# Patient Record
Sex: Female | Born: 2000 | Race: White | Hispanic: No | Marital: Single | Smoking: Never smoker
Health system: Southern US, Community
[De-identification: ages and names within clinical notes are randomized; demographics above are authoritative.]

## PROBLEM LIST (undated history)

## (undated) DIAGNOSIS — F909 Attention-deficit hyperactivity disorder, unspecified type: Secondary | ICD-10-CM

## (undated) DIAGNOSIS — F419 Anxiety disorder, unspecified: Secondary | ICD-10-CM

## (undated) DIAGNOSIS — J45909 Unspecified asthma, uncomplicated: Secondary | ICD-10-CM

## (undated) HISTORY — DX: Unspecified asthma, uncomplicated: J45.909

---
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
## 2022-03-12 ENCOUNTER — Emergency Department
Admission: EM | Admit: 2022-03-12 | Discharge: 2022-03-12 | Discharge status: Against Medical Advice | Payer: Self-pay | Source: Home / Self Care

## 2022-03-12 ENCOUNTER — Encounter: Payer: Self-pay | Admitting: Emergency Medicine

## 2022-03-12 ENCOUNTER — Emergency Department: Payer: Managed Care, Other (non HMO)

## 2022-03-12 ENCOUNTER — Other Ambulatory Visit: Payer: Self-pay

## 2022-03-12 DIAGNOSIS — J039 Acute tonsillitis, unspecified: Secondary | ICD-10-CM | POA: Diagnosis not present

## 2022-03-12 DIAGNOSIS — J181 Lobar pneumonia, unspecified organism: Secondary | ICD-10-CM | POA: Diagnosis not present

## 2022-03-12 DIAGNOSIS — R509 Fever, unspecified: Secondary | ICD-10-CM | POA: Diagnosis present

## 2022-03-12 DIAGNOSIS — Z20822 Contact with and (suspected) exposure to covid-19: Secondary | ICD-10-CM | POA: Insufficient documentation

## 2022-03-12 LAB — BASIC METABOLIC PANEL
Anion gap: 7 (ref 5–15)
BUN: 8 mg/dL (ref 6–20)
CO2: 24 mmol/L (ref 22–32)
Calcium: 8.7 mg/dL — ABNORMAL LOW (ref 8.9–10.3)
Chloride: 103 mmol/L (ref 98–111)
Creatinine, Ser: 0.71 mg/dL (ref 0.44–1.00)
GFR, Estimated: >60 mL/min (ref >60)
Glucose, Bld: 86 mg/dL (ref 70–99)
Potassium: 3.6 mmol/L (ref 3.5–5.1)
Sodium: 134 mmol/L — ABNORMAL LOW (ref 135–145)

## 2022-03-12 LAB — GROUP A STREP BY PCR: Group A Strep by PCR: NOT DETECTED

## 2022-03-12 LAB — TROPONIN I (HIGH SENSITIVITY): Troponin I (High Sensitivity): 3 ng/L (ref <18)

## 2022-03-12 LAB — CBC
HCT: 37.4 % (ref 36.0–46.0)
Hemoglobin: 12.3 g/dL (ref 12.0–15.0)
MCH: 27.5 pg (ref 26.0–34.0)
MCHC: 32.9 g/dL (ref 30.0–36.0)
MCV: 83.5 fL (ref 80.0–100.0)
Platelets: 298 10*3/uL (ref 150–400)
RBC: 4.48 MIL/uL (ref 3.87–5.11)
RDW: 12.6 % (ref 11.5–15.5)
WBC: 11.5 10*3/uL — ABNORMAL HIGH (ref 4.0–10.5)
nRBC: 0 % (ref 0.0–0.2)

## 2022-03-12 NOTE — ED Triage Notes (Signed)
Pt to ED from home c/o fever, sore throat, congestion, chest pain, SOB, and fatigue for about 3 weeks.  States treated for strep 2 weeks ago and finish ABX.  Nausea without vomiting or diarrhea.  Chest pain is mid and achy feeling.  Pt A&Ox4, chest rise even and unlabored, skin WNL and in NAD at this time. ?

## 2022-03-13 ENCOUNTER — Emergency Department
Admission: EM | Admit: 2022-03-13 | Discharge: 2022-03-13 | Disposition: A | Discharge status: Home / Self Care | Payer: Managed Care, Other (non HMO) | Attending: Emergency Medicine | Admitting: Emergency Medicine

## 2022-03-13 DIAGNOSIS — J039 Acute tonsillitis, unspecified: Secondary | ICD-10-CM

## 2022-03-13 DIAGNOSIS — J189 Pneumonia, unspecified organism: Secondary | ICD-10-CM

## 2022-03-13 LAB — RESP PANEL BY RT-PCR (FLU A&B, COVID) ARPGX2
Influenza A by PCR: NEGATIVE
Influenza B by PCR: NEGATIVE
SARS Coronavirus 2 by RT PCR: NEGATIVE

## 2022-03-13 LAB — POC URINE PREG, ED: Preg Test, Ur: NEGATIVE

## 2022-03-13 LAB — MONONUCLEOSIS SCREEN: Mono Screen: NEGATIVE

## 2022-03-13 MED ORDER — CLINDAMYCIN HCL 300 MG PO CAPS: 300.0000 mg | ORAL_CAPSULE | Freq: Three times a day (TID) | ORAL | 0 refills | Status: DC

## 2022-03-13 MED ORDER — SODIUM CHLORIDE 0.9 % IV BOLUS
1000.0000 mL | Freq: Once | INTRAVENOUS | Status: AC
  Administered 2022-03-13: 1000 mL via INTRAVENOUS

## 2022-03-13 MED ORDER — DEXAMETHASONE SODIUM PHOSPHATE 10 MG/ML IJ SOLN
10.0000 mg | Freq: Once | INTRAMUSCULAR | Status: AC
  Administered 2022-03-13: 10 mg via INTRAVENOUS
  Filled 2022-03-13: qty 1

## 2022-03-13 MED ORDER — CLINDAMYCIN HCL 150 MG PO CAPS
300.0000 mg | ORAL_CAPSULE | Freq: Once | ORAL | Status: AC
  Administered 2022-03-13: 300 mg via ORAL
  Filled 2022-03-13: qty 2

## 2022-03-13 MED ORDER — KETOROLAC TROMETHAMINE 30 MG/ML IJ SOLN
10.0000 mg | Freq: Once | INTRAMUSCULAR | Status: AC
  Administered 2022-03-13: 9.9 mg via INTRAVENOUS
  Filled 2022-03-13: qty 1

## 2022-03-13 MED ORDER — MAGIC MOUTHWASH: ORAL | 0 refills | Status: DC

## 2022-03-13 MED ORDER — MAGIC MOUTHWASH
10.0000 mL | Freq: Once | ORAL | Status: AC
Start: 2022-03-13 — End: 2022-03-13
  Administered 2022-03-13: 10 mL via ORAL
  Filled 2022-03-13 (×2): qty 10

## 2022-03-13 NOTE — Discharge Instructions (Signed)
1.  Take antibiotic as prescribed (Clindamycin 300mg   ?3 times daily x10 days). ?2.  You may use Magic mouthwash as needed for throat discomfort. ?3.  Alternate Tylenol and Ibuprofen every 4 hours as needed for fever greater than 100.4 ?F. ?4.  Drink plenty of fluids daily. ?5.  Return to the ER for worsening symptoms, persistent vomiting, difficulty breathing or other concerns. ?

## 2022-03-13 NOTE — ED Provider Notes (Signed)
? ?[facility] ?Provider Note ? ? ? Event Date/Time  ? First MD Initiated Contact with Patient 03/13/22 0249   ?  (approximate) ? ? ?History  ? ?Chest Pain, Fatigue, Nasal Congestion, Sore Throat, and Fever ? ? ?HPI ? ?Olivia Tran is a 21 y.o. female who presents to the ED from school with a chief complaint of fever, sore throat, cough, congestion, chest pain, shortness of breath, fatigue.  Symptoms ongoing for 3 weeks.  States she was treated for strep 2 weeks ago with penicillin which she has finished.  Endorses nausea and sore throat.  Chest feels sore and achy.  Denies fever, chills, shortness of breath, abdominal pain, vomiting or dizziness.  Patient has Nexplanon. ?  ? ? ?Past Medical History  ? ?Past Medical History:  ?Diagnosis Date  ? ADHD   ? Anxiety   ? ? ? ?Active Problem List  ?There are no problems to display for this patient. ? ? ? ?Past Surgical History  ?History reviewed. No pertinent surgical history. ? ? ?Home Medications  ? ?Prior to Admission medications   ?Medication Sig Start Date End Date Taking? Authorizing Provider  ?etonogestrel (NEXPLANON) 68 MG IMPL implant 1 each by Subdermal route once.    [provider]  ? ? ? ?Allergies  ?Other ? ? ?Family History  ?History reviewed. No pertinent family history. ? ? ?Physical Exam  ?Triage Vital Signs: ?ED Triage Vitals  ?Enc Vitals Group  ?   BP 03/12/22 2256 113/65  ?   Pulse Rate 03/12/22 2256 (!) 118  ?   Resp 03/12/22 2256 16  ?   Temp 03/12/22 2256 100.1 ?F (37.8 ?C)  ?   Temp Source 03/12/22 2256 Oral  ?   SpO2 03/12/22 2256 98 %  ?   Weight 03/12/22 2302 100 lb (45.4 kg)  ?   Height 03/12/22 2302 5\' 1"  (1.549 m)  ?   Head Circumference --   ?   Peak Flow --   ?   Pain Score 03/12/22 2302 4  ?   Pain Loc --   ?   Pain Edu? --   ?   Excl. in [place]? --   ? ? ?Updated Vital Signs: ?BP 112/69   Pulse (!) 105   Temp 100.1 ?F (37.8 ?C) (Oral)   Resp 15   Ht 5\' 1"  (1.549 m)   Wt 45.4 kg   LMP 02/19/2022  (Approximate)   SpO2 96%   BMI 18.89 kg/m?  ? ? ?General: Awake, no distress.  ?CV:  Tachycardic.  Good peripheral perfusion.  ?Resp:  Normal effort.  CTA B. ?Abd:  Nontender.  No distention.  ?Other:  Rhinorrhea/congestion noted.  Moderately erythematous oropharynx with bilaterally symmetrical enlarged tonsils with exudates.  No peritonsillar abscess.  There is no hoarse or muffled voice.  There is no drooling.  Shotty anterior lymphadenopathy.  Supple neck without meningismus.  No petechiae. ? ? ?ED Results / Procedures / Treatments  ?Labs ?(all labs ordered are listed, but only abnormal results are displayed) ?Labs Reviewed  ?BASIC METABOLIC PANEL - Abnormal; Notable for the following components:  ?    Result Value  ? Sodium 134 (*)   ? Calcium 8.7 (*)   ? All other components within normal limits  ?CBC - Abnormal; Notable for the following components:  ? WBC 11.5 (*)   ? All other components within normal limits  ?RESP PANEL BY RT-PCR (FLU A&B, COVID) ARPGX2  ?GROUP A  STREP BY PCR  ?MONONUCLEOSIS SCREEN  ?POC URINE PREG, ED  ?TROPONIN I (HIGH SENSITIVITY)  ? ? ? ?EKG ? ?ED ECG REPORT ?I, Paulette Blanch, the attending physician, personally viewed and interpreted this ECG. ? ? Date: 03/13/2022 ? EKG Time: 2302 ? Rate: 116 ? Rhythm: sinus tachycardia ? Axis: Normal ? Intervals:none ? ST&T Change: Nonspecific ? ? ? ?RADIOLOGY ?I have independently visualized and interpreted patient's chest x-ray as well as noted the radiology interpretation: ? ?Chest x-ray: Streaky retrocardiac opacity at left lung base atelectasis versus pneumonia.  Mild central bronchial thickening. ? ?Official radiology report(s): ?DG Chest 2 View ? ?Result Date: 03/12/2022 ?CLINICAL DATA:  Chest pain. EXAM: CHEST - 2 VIEW COMPARISON:  None. FINDINGS: The heart is normal in size. Streaky retrocardiac opacity at the left lung base. Mild central bronchial thickening. No pulmonary edema, pleural effusion, or pneumothorax. No acute osseous findings.  Nexplanon in the left upper extremity. IMPRESSION: Streaky retrocardiac opacity at the left lung base may be atelectasis or pneumonia. Mild central bronchial thickening. Electronically Signed   By: Keith Rake M.D.   On: 03/12/2022 23:30   ? ? ?PROCEDURES: ? ?Critical Care performed: No ? ?.1-3 Lead EKG Interpretation ?Performed by: Paulette Blanch, MD ?Authorized by: Paulette Blanch, MD  ? ?  Interpretation: abnormal   ?  ECG rate:  110 ?  ECG rate assessment: tachycardic   ?  Rhythm: sinus tachycardia   ?  Ectopy: none   ?  Conduction: normal   ?Comments:  ?   Patient placed on cardiac monitor to evaluate for arrhythmias ? ? ?MEDICATIONS ORDERED IN ED: ?Medications  ?sodium chloride 0.9 % bolus 1,000 mL (0 mLs Intravenous Stopped 03/13/22 0431)  ?ketorolac (TORADOL) 30 MG/ML injection 9.9 mg (9.9 mg Intravenous Given 03/13/22 0347)  ?dexamethasone (DECADRON) injection 10 mg (10 mg Intravenous Given 03/13/22 0343)  ?magic mouthwash (10 mLs Oral Given 03/13/22 0458)  ?clindamycin (CLEOCIN) capsule 300 mg (300 mg Oral Given 03/13/22 0330)  ? ? ? ?IMPRESSION / MDM / ASSESSMENT AND PLAN / ED COURSE  ?I reviewed the triage vital signs and the nursing notes. ?             ?               ?21 year old female presenting with a several week history of cough, congestion, sore throat and fatigue.  Differential diagnosis includes but is not limited to viral process such as COVID-19, influenza, community-acquired pneumonia, pharyngitis, tonsillitis, mononucleosis, etc.  I have personally reviewed patient's records and am unable to see recent records of illness; last seen at urgent care 10/01/2021 for viral illness. ? ?The patient is on the cardiac monitor to evaluate for evidence of arrhythmia and/or significant heart rate changes. ? ?Laboratory results demonstrate mild leukocytosis with WBC 11.5, normal creatinine 0.71, negative troponin.  Respiratory panel and group A strep are negative.  Chest x-ray with possible pneumonia.  Will  check Monospot.  Administer IV fluid hydration, Decadron for tonsillar swelling, ketorolac for body aches and pain, Magic mouthwash for sore throat.  Will start clindamycin which should cover both tonsillitis and pneumonia. ? ?Clinical Course as of 03/13/22 0525  ?Thu Mar 13, 2022  ?0523 Patient resting in no acute distress.  Heart rate normalized.  Feeling significantly better.  Updated her on negative monoscreen.  Will discharge home with prescription for clindamycin and Magic mouthwash.  Will refer to ENT for outpatient follow-up.  Strict return precautions given.  Patient verbalizes understanding and agrees with plan of care. [JS]  ?  ?Clinical Course User Index ?[JS] Paulette Blanch, MD  ? ? ? ?FINAL CLINICAL IMPRESSION(S) / ED DIAGNOSES  ? ?Final diagnoses:  ?Tonsillitis  ?Community acquired pneumonia of left lung, unspecified part of lung  ? ? ? ?Rx / DC Orders  ? ?ED Discharge Orders   ? ? None  ? ?  ? ? ? ?Note:  This document was prepared using Dragon voice recognition software and may include unintentional dictation errors. ?  ?Paulette Blanch, MD ?03/13/22 [phone] ? ?

## 2022-07-16 IMAGING — CR DG CHEST 2V
2 series · 2 of 2 positions shown · non-contrast
Comparison: None.

CLINICAL DATA: Chest pain.

EXAM:
CHEST - 2 VIEW

[chest pa]
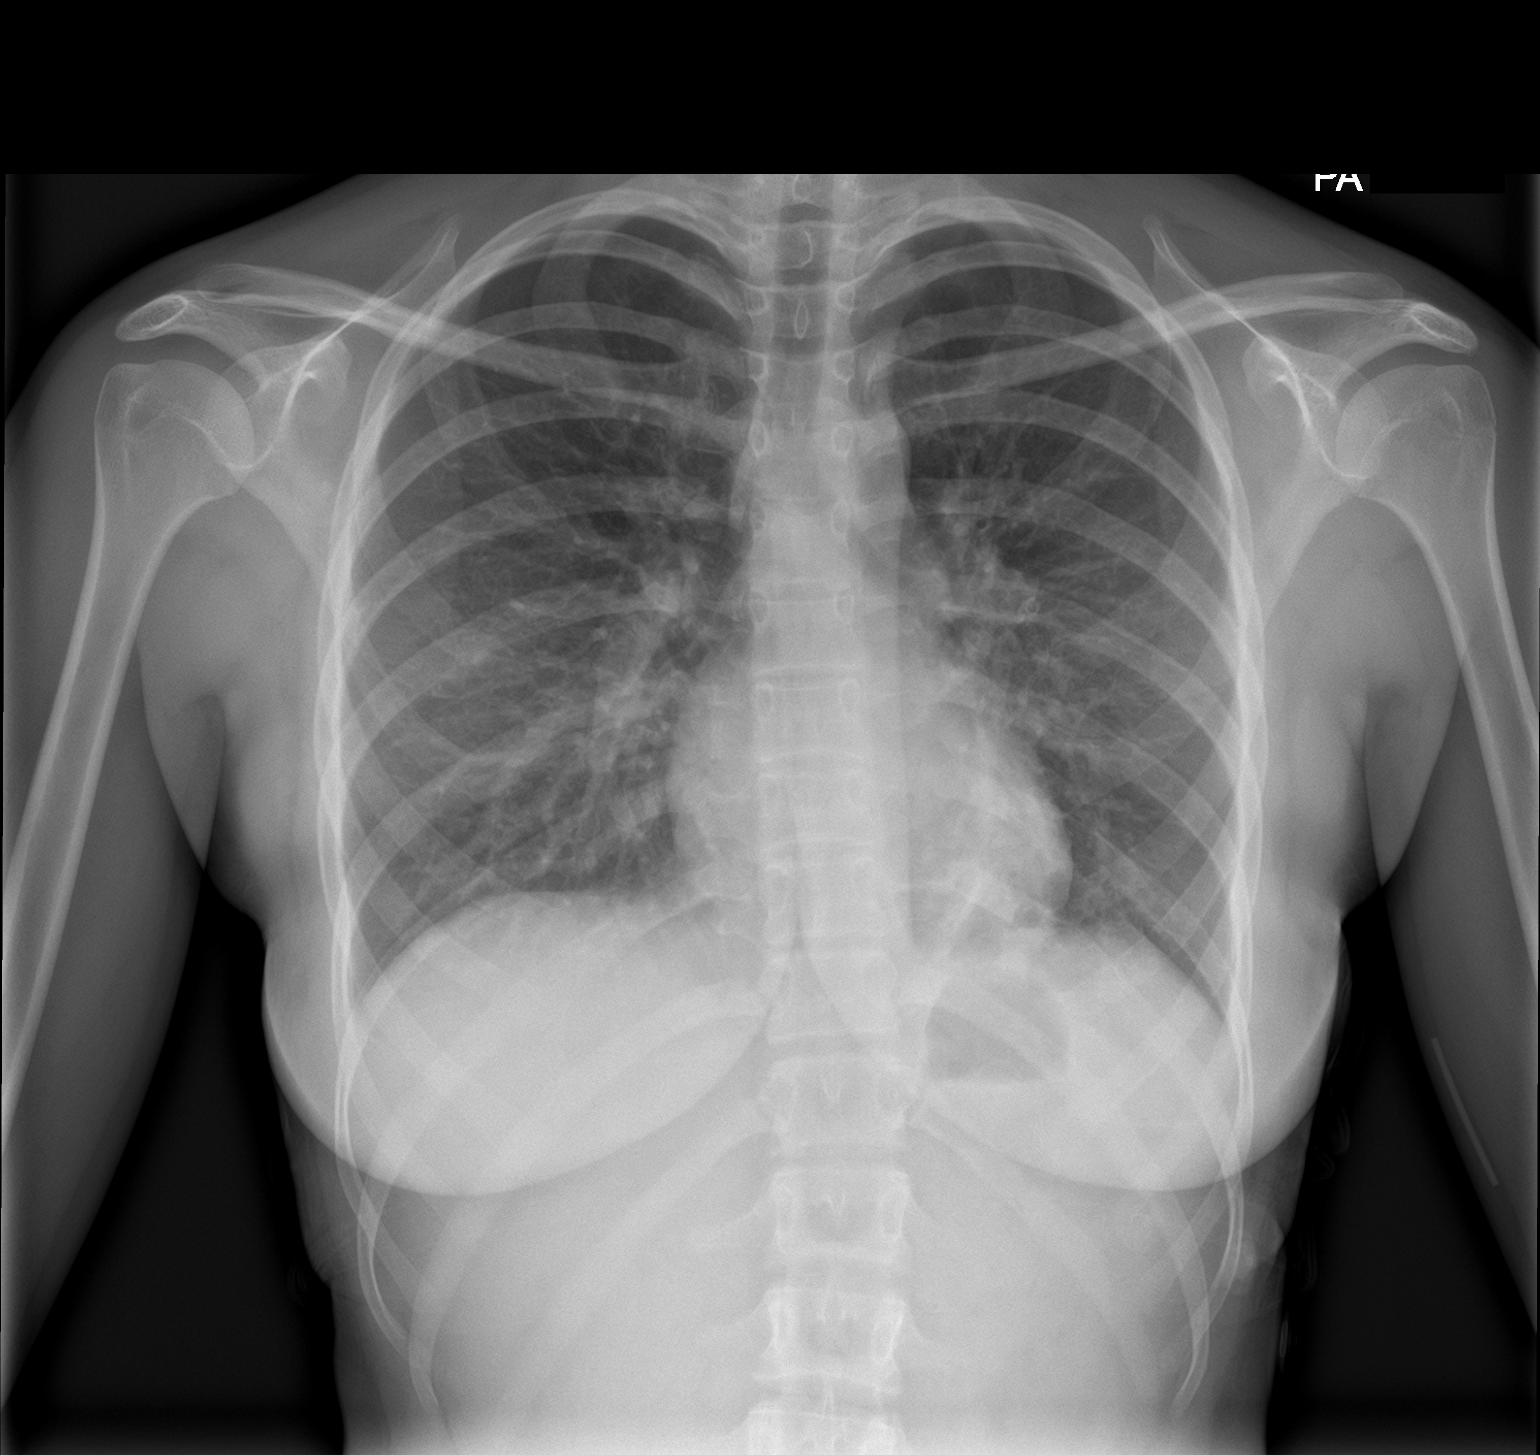

[chest lat]
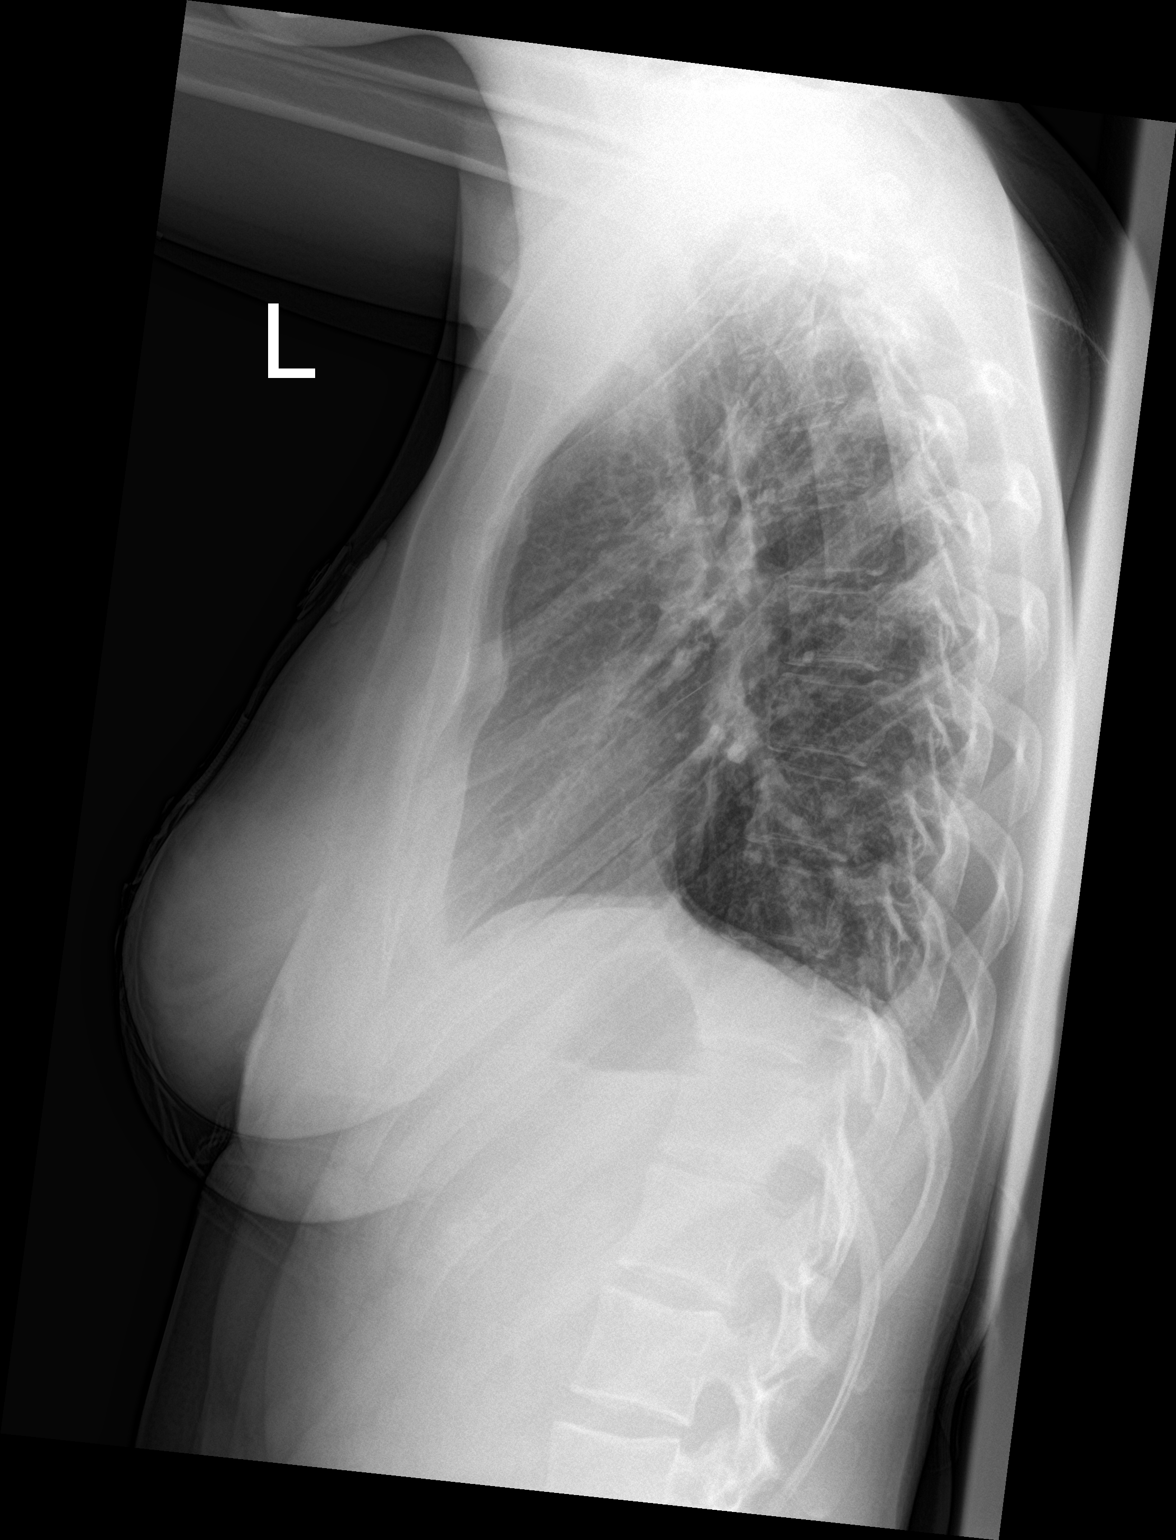

[2 of 2 positions shown; findings below may reference images not displayed]

FINDINGS: The heart is normal in size. Streaky retrocardiac opacity at the
left lung base. Mild central bronchial thickening. No pulmonary
edema, pleural effusion, or pneumothorax. No acute osseous findings.
Nexplanon in the left upper extremity.
IMPRESSION: Streaky retrocardiac opacity at the left lung base may be
atelectasis or pneumonia. Mild central bronchial thickening.

## 2022-08-19 ENCOUNTER — Other Ambulatory Visit: Payer: Self-pay

## 2022-08-19 ENCOUNTER — Ambulatory Visit (INDEPENDENT_AMBULATORY_CARE_PROVIDER_SITE_OTHER): Payer: Managed Care, Other (non HMO) | Admitting: Family Medicine

## 2022-08-19 VITALS — BP 121/71 | HR 88 | Temp 99.2°F | Ht 61.42 in | Wt 110.0 lb

## 2022-08-19 DIAGNOSIS — J302 Other seasonal allergic rhinitis: Secondary | ICD-10-CM | POA: Diagnosis not present

## 2022-08-19 DIAGNOSIS — N926 Irregular menstruation, unspecified: Secondary | ICD-10-CM

## 2022-08-19 DIAGNOSIS — F419 Anxiety disorder, unspecified: Secondary | ICD-10-CM | POA: Diagnosis not present

## 2022-08-19 DIAGNOSIS — K120 Recurrent oral aphthae: Secondary | ICD-10-CM

## 2022-08-19 DIAGNOSIS — J45909 Unspecified asthma, uncomplicated: Secondary | ICD-10-CM

## 2022-08-19 DIAGNOSIS — F988 Other specified behavioral and emotional disorders with onset usually occurring in childhood and adolescence: Secondary | ICD-10-CM

## 2022-08-19 DIAGNOSIS — R5383 Other fatigue: Secondary | ICD-10-CM

## 2022-08-19 MED ORDER — MONTELUKAST SODIUM 10 MG PO TABS: 10.0000 mg | ORAL_TABLET | Freq: Every day | ORAL | 3 refills | Status: DC

## 2022-08-19 NOTE — Progress Notes (Unsigned)
[facility] [address] [place], [place] [address] Phone: [phone] Fax: [phone]   Office Visit Note  Patient Name: Olivia Tran  Date of [id number]  Med Rec number [id number]  Date of Service: 08/20/2022  Other  Chief Complaint  Patient presents with   multiple concerns     21 year old student with "lots of issues" Last night felt like things were moving too fast - when looking around Ear problems -clogged especially the left one Left tonsil painful Allergy tests done over the summer - johnson grass and sycamore trees`both positive - gets hives with [place] pigs but skin test was negative for [place] pigs Having fatigue - improved over the summer but feels almost like her mono again Has had 3 recent deaths in the past 2 months - friend in MVA, one family member (her grandfather)and one close family friend (3 days ago) Has had lots of canker sores all summer, also has a "cut" under her tongue Using her inhalers and her flonase, as well as her zyrtec but intermittently  Eyes have been watering,  Also spotting mixed with heavy vaginal bleeding - despite nexplanon and continuous OCP (started that 10/2021) - helped for about three months - her gyn left the practice in March and she has just stayed on the pill since then Has missed class already this semester Classes  - environmental sinus lab (has not missed any) has missed some classes for this, has missed some econ classes, also Com classes  Takes her adderall, has not taken propranolol, which is as needed fro anxiety. Does not have ant-depressant medciation    Current Medication:  Outpatient Encounter Medications as of 08/19/2022  Medication Sig   montelukast (SINGULAIR) 10 MG tablet Take 1 tablet (10 mg total) by mouth at bedtime.   propranolol (INDERAL) 40 MG tablet Take 40 mg by mouth 3 (three) times daily.   amphetamine-dextroamphetamine (ADDERALL XR) 5 MG 24 hr capsule Take 5-10 mg by mouth every  morning.   clindamycin (CLEOCIN) 300 MG capsule Take 1 capsule (300 mg total) by mouth 3 (three) times daily. (Patient not taking: Reported on 08/19/2022)   etonogestrel (NEXPLANON) 68 MG IMPL implant 1 each by Subdermal route once.   magic mouthwash SOLN 35mL Anbesol 43mL Benadryl 80mL Mylanta  49mL swish, gargle & spit q8hr prn throat discomfort (Patient not taking: Reported on 08/19/2022)   norgestimate-ethinyl estradiol (ORTHO-CYCLEN) 0.25-35 MG-MCG tablet Take by mouth.   No facility-administered encounter medications on file as of 08/19/2022.      Medical History: Past Medical History:  Diagnosis Date   ADHD    Anxiety      Vital Signs: BP 121/71   Pulse 88   Temp 99.2 F (37.3 C) (Tympanic)   Ht 5' 1.42" (1.56 m)   Wt 110 lb (49.9 kg)   SpO2 99%   BMI 20.50 kg/m    Review of Systems  Constitutional:  Positive for activity change and fatigue.  HENT:  Positive for congestion, ear pain and mouth sores.   Eyes:  Positive for discharge. Negative for redness.  Respiratory:  Positive for shortness of breath. Negative for chest tightness.   Gastrointestinal:  Negative for constipation, diarrhea and nausea.  Endocrine: Negative for cold intolerance and heat intolerance.  Genitourinary:  Positive for menstrual problem and vaginal bleeding. Negative for difficulty urinating.  Musculoskeletal:  Negative for arthralgias.  Skin:  Negative for rash.  Allergic/Immunologic: Positive for environmental allergies.  Neurological:  Positive for light-headedness.  Negative for syncope.  Psychiatric/Behavioral:         Anxiety and depression - see HPI    Physical Exam Vitals reviewed.  Constitutional:      General: She is not in acute distress.    Appearance: She is not ill-appearing.  HENT:     Right Ear: Ear canal normal. A middle ear effusion is present. Tympanic membrane is not bulging.     Left Ear: Ear canal normal. A middle ear effusion is present. Tympanic membrane is not  bulging. Tympanic membrane has decreased mobility.     Ears:     Comments: Clear fluid behind Tms with scarring on the left    Nose: Congestion and rhinorrhea present.     Mouth/Throat:     Mouth: Oral lesions present.     Pharynx: No oropharyngeal exudate or posterior oropharyngeal erythema.     Comments: Single healing aphthous ulcer under tongue Eyes:     Conjunctiva/sclera: Conjunctivae normal.     Pupils: Pupils are equal, round, and reactive to light.  Cardiovascular:     Rate and Rhythm: Normal rate and regular rhythm.  Pulmonary:     Effort: Pulmonary effort is normal.     Breath sounds: Normal breath sounds. No wheezing or rhonchi.  Neurological:     Mental Status: She is alert.       Assessment/Plan:  1. Moderate asthma without complication, unspecified whether persistent Continue to use inhalers a sprescribed  2. Seasonal allergies Not currently controlled - use her flonase 2 sprays to each nostril daily Continue zyrtec at bedtime Add singulair to see if this helps both asthma and allergies - montelukast (SINGULAIR) 10 MG tablet; Take 1 tablet (10 mg total) by mouth at bedtime.  Dispense: 30 tablet; Refill: 3  3. Irregular menses Stop OCP - lets see how she does with just - norgestimate-ethinyl estradiol (ORTHO-CYCLEN) 0.25-35 MG-MCG tablet; Take by mouth.  4. Anxiety disorder, unspecified type Continue to see usual porescriber - propranolol (INDERAL) 40 MG tablet; Take 40 mg by mouth 3 (three) times daily.  5. Fatigue, unspecified type May be due to allergies, may be due to prolonged menstrual bleeding - if not improved at next visit will check labs  6. Attention deficit disorder, unspecified hyperactivity presence  - amphetamine-dextroamphetamine (ADDERALL XR) 5 MG 24 hr capsule; Take 5-10 mg by mouth every morning.  7. Ulcer aphthous oral Resolving - suspect due to underlying stress      General Counseling: Nathalie verbalizes understanding of the  findings of todays visit and agrees with plan of treatment. I have discussed any further diagnostic evaluation that may be needed or ordered today. We also reviewed her medications today. she has been encouraged to call the office with any questions or concerns that should arise related to todays visit.   No orders of the defined types were placed in this encounter.   Meds ordered this encounter  Medications   montelukast (SINGULAIR) 10 MG tablet    Sig: Take 1 tablet (10 mg total) by mouth at bedtime.    Dispense:  30 tablet    Refill:  3    Time spent:50 Minutes    Dr Evette Doffing Jeancarlos Marchena AB[facility] University Physician

## 2022-08-20 ENCOUNTER — Encounter: Payer: Self-pay | Admitting: Family Medicine

## 2022-08-25 ENCOUNTER — Encounter: Payer: Self-pay | Admitting: Family Medicine

## 2022-08-25 ENCOUNTER — Ambulatory Visit
Admission: EM | Admit: 2022-08-25 | Discharge: 2022-08-25 | Disposition: A | Discharge status: Home / Self Care | Payer: Managed Care, Other (non HMO) | Attending: Urgent Care | Admitting: Urgent Care

## 2022-08-25 DIAGNOSIS — R5382 Chronic fatigue, unspecified: Secondary | ICD-10-CM

## 2022-08-25 NOTE — ED Provider Notes (Signed)
Roderic Palau    CSN: [id number] Arrival date & time: 08/25/22  [id number]      History   Chief Complaint Chief Complaint  Patient presents with   Fatigue    I have more than that, but i need blood work done. [facility] until thursday, cant wait that long. - Entered by patient   Dizziness    HPI Olivia Tran is a 21 y.o. female.    Dizziness   Presents to urgent care with report of profound fatigue and dizziness over the past week.  She reports increased vaginal bleeding intermittently over the past year and a half since having Nexplanon implanted.  She was taking supplemental birth control ("estrogen") to help control bleeding but was told to stop by the health clinic at school.  She reports feeling at least 4 pads a day.  Clinic offered to schedule lab testing in 1 week due to concern for acute anemia.  Patient presents to urgent care to request immediate lab.  Addition to the fatigue and dizziness, she also reports some auditory changes.  Describes it as "hypersensitive hearing ".  Past Medical History:  Diagnosis Date   ADHD    Anxiety     There are no problems to display for this patient.   History reviewed. No pertinent surgical history.  OB History   No obstetric history on file.      Home Medications    Prior to Admission medications   Medication Sig Start Date End Date Taking? Authorizing Provider  amphetamine-dextroamphetamine (ADDERALL XR) 5 MG 24 hr capsule Take 5-10 mg by mouth every morning. 07/17/22   [provider]  etonogestrel (NEXPLANON) 68 MG IMPL implant 1 each by Subdermal route once.    [provider]  magic mouthwash SOLN 89mL Anbesol 49mL Benadryl 61mL Mylanta  64mL swish, gargle & spit q8hr prn throat discomfort Patient not taking: Reported on 08/19/2022 03/13/22   Paulette Blanch, MD  montelukast (SINGULAIR) 10 MG tablet Take 1 tablet (10 mg total) by mouth at bedtime. 08/19/22    Dutch Gray, MD  norgestimate-ethinyl estradiol (ORTHO-CYCLEN) 0.25-35 MG-MCG tablet Take by mouth. 08/18/22   [provider]  propranolol (INDERAL) 40 MG tablet Take 40 mg by mouth 3 (three) times daily.    [provider]    Family History History reviewed. No pertinent family history.  Social History Social History   Tobacco Use   Smoking status: Never   Smokeless tobacco: Never  Substance Use Topics   Alcohol use: Never   Drug use: Never     Allergies   Other   Review of Systems Review of Systems  Neurological:  Positive for dizziness.     Physical Exam Triage Vital Signs ED Triage Vitals [08/25/22 1918]  Enc Vitals Group     BP 105/72     Pulse Rate 79     Resp 17     Temp 97.7 F (36.5 C)     Temp src      SpO2 98 %     Weight      Height      Head Circumference      Peak Flow      Pain Score 0     Pain Loc      Pain Edu?      Excl. in [employer]?    No data found.  Updated Vital Signs BP 105/72   Pulse 79  Temp 97.7 F (36.5 C)   Resp 17   LMP 08/18/2022 (Approximate)   SpO2 98%   Visual Acuity Right Eye Distance:   Left Eye Distance:   Bilateral Distance:    Right Eye Near:   Left Eye Near:    Bilateral Near:     Physical Exam Vitals reviewed.  Constitutional:      Appearance: Normal appearance.  Cardiovascular:     Rate and Rhythm: Normal rate and regular rhythm.  Neurological:     General: No focal deficit present.     Mental Status: She is alert and oriented to person, place, and time.  Psychiatric:        Mood and Affect: Mood normal.        Behavior: Behavior normal.      UC Treatments / Results  Labs (all labs ordered are listed, but only abnormal results are displayed) Labs Reviewed  CBC  IRON AND TIBC    EKG   Radiology No results found.  Procedures Procedures (including critical care time)  Medications Ordered in UC Medications - No data to display  Initial Impression /  Assessment and Plan / UC Course  I have reviewed the triage vital signs and the nursing notes.  Pertinent labs & imaging results that were available during my care of the patient were reviewed by me and considered in my medical decision making (see chart for details).   We will draw CBC and iron panel today to assess for acute iron deficiency anemia.  Given her history of dysmenorrhea, suggested starting iron supplements with vitamin C.   Final Clinical Impressions(s) / UC Diagnoses   Final diagnoses:  Chronic fatigue   Discharge Instructions   None    ED Prescriptions   None    PDMP not reviewed this encounter.   Rose Phi, Ione 08/25/22 1947

## 2022-08-25 NOTE — ED Triage Notes (Signed)
Pt. States she has been experiencing fatigue and dizziness for the past week. Pt. Also mentions she has been having increased vaginal bleeding d/t a new birth control she has started. Pt.'s primary provider expressed cocern for anemia.

## 2022-08-25 NOTE — Discharge Instructions (Addendum)
Follow-up with your primary care provider.  Your lab values will be posted to your MyChart account.  If there are any acutely low values, you will be contacted to discuss next steps for treatment.

## 2022-08-27 LAB — IRON AND TIBC
Iron Saturation: 7 % — CL (ref 15–55)
Iron: 38 ug/dL (ref 27–159)
Total Iron Binding Capacity: 522 ug/dL — ABNORMAL HIGH (ref 250–450)
UIBC: 484 ug/dL — ABNORMAL HIGH (ref 131–425)

## 2022-08-27 LAB — CBC
Hematocrit: 43.6 % (ref 34.0–46.6)
Hemoglobin: 14.2 g/dL (ref 11.1–15.9)
MCH: 27.2 pg (ref 26.6–33.0)
MCHC: 32.6 g/dL (ref 31.5–35.7)
MCV: 84 fL (ref 79–97)
Platelets: 351 10*3/uL (ref 150–450)
RBC: 5.22 x10E6/uL (ref 3.77–5.28)
RDW: 14.5 % (ref 11.7–15.4)
WBC: 7.5 10*3/uL (ref 3.4–10.8)

## 2022-09-02 DIAGNOSIS — B2790 Infectious mononucleosis, unspecified without complication: Secondary | ICD-10-CM

## 2022-09-02 HISTORY — DX: Infectious mononucleosis, unspecified without complication: B27.90

## 2022-09-04 ENCOUNTER — Encounter: Payer: Self-pay | Admitting: Family Medicine

## 2022-09-04 ENCOUNTER — Ambulatory Visit (INDEPENDENT_AMBULATORY_CARE_PROVIDER_SITE_OTHER): Payer: Managed Care, Other (non HMO) | Admitting: Family Medicine

## 2022-09-04 VITALS — BP 106/58 | HR 68 | Temp 97.7°F | Resp 18 | Wt 111.0 lb

## 2022-09-04 DIAGNOSIS — J45909 Unspecified asthma, uncomplicated: Secondary | ICD-10-CM | POA: Diagnosis not present

## 2022-09-04 DIAGNOSIS — B2700 Gammaherpesviral mononucleosis without complication: Secondary | ICD-10-CM

## 2022-09-04 DIAGNOSIS — N926 Irregular menstruation, unspecified: Secondary | ICD-10-CM

## 2022-09-04 DIAGNOSIS — F988 Other specified behavioral and emotional disorders with onset usually occurring in childhood and adolescence: Secondary | ICD-10-CM

## 2022-09-04 DIAGNOSIS — R300 Dysuria: Secondary | ICD-10-CM

## 2022-09-04 DIAGNOSIS — D500 Iron deficiency anemia secondary to blood loss (chronic): Secondary | ICD-10-CM

## 2022-09-04 DIAGNOSIS — F419 Anxiety disorder, unspecified: Secondary | ICD-10-CM

## 2022-09-04 NOTE — Progress Notes (Signed)
[facility] [address] [place], [place] [address] Phone: [phone] Fax: [phone]   Office Visit Note  Patient Name: Olivia Tran  Date of HERDE:[id number]  Med Rec number [id number]  Date of Service: 09/04/2022  Other  No chief complaint on file.    See labs sent to me earlier today - has mono again, low iron, uncontrolled allergies Daily fatigue with exacerbations due to mono Since I saw Clella last she has been to Urgent care and seen her PCP at home  Current classes - Econ, Outdoor learning, Com class, Psychologist, educational and Adult nurse with attendance at [employer] and assignments with econ Already registered with disability resources for her ADD and anxiety Current accommodations - extra time for exams and assignments, ability to miss class for disability related reason,   Actively wants to find a way that she can continue in school and not fall behind in class work despite the current illnesses     Current Medication:  Outpatient Encounter Medications as of 09/04/2022  Medication Sig   amphetamine-dextroamphetamine (ADDERALL XR) 5 MG 24 hr capsule Take 5-10 mg by mouth every morning.   etonogestrel (NEXPLANON) 68 MG IMPL implant 1 each by Subdermal route once.   magic mouthwash SOLN 60mL Anbesol 13mL Benadryl 66mL Mylanta  12mL swish, gargle & spit q8hr prn throat discomfort (Patient not taking: Reported on 08/19/2022)   montelukast (SINGULAIR) 10 MG tablet Take 1 tablet (10 mg total) by mouth at bedtime.   norgestimate-ethinyl estradiol (ORTHO-CYCLEN) 0.25-35 MG-MCG tablet Take by mouth.   propranolol (INDERAL) 40 MG tablet Take 40 mg by mouth 3 (three) times daily.   No facility-administered encounter medications on file as of 09/04/2022.      Medical History: Past Medical History:  Diagnosis Date   ADHD    Anxiety    Asthma    Infectious mononucleosis 09/02/2022     Vital Signs: BP (!) 106/58   Pulse 68   Temp 97.7 F (36.5  C) (Tympanic)   Resp 18   Wt 111 lb (50.3 kg)   LMP 08/18/2022 (Approximate)   SpO2 99%   BMI 20.69 kg/m    Review of Systems  Constitutional:  Positive for activity change and fatigue.  Genitourinary:  Positive for menstrual problem.  Allergic/Immunologic: Positive for environmental allergies.  Psychiatric/Behavioral:  The patient is nervous/anxious.     Physical Exam Constitutional:      General: She is not in acute distress.    Appearance: She is not ill-appearing, toxic-appearing or diaphoretic.  HENT:     Ears:     Comments: Clear effusion behind each TM Neurological:     Mental Status: She is alert.  Psychiatric:        Mood and Affect: Mood normal.        Behavior: Behavior normal.        Thought Content: Thought content normal.        Judgment: Judgment normal.    Assessment/Plan:  1. 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 Iron deficiency anemia due to chronic blood loss Taking iron supplements  3. Asthma due to seasonal allergies Unfortunately singulair is not making a significant difference - Zeeva will wear a mask when outdoors especially when outdoors  for her classes to protect her from the environmental allergens  4. Anxiety disorder, unspecified type Shantia does not find her propranolol very helpful but has concerns about starting other medication due to the number of "medication trials" she had to go through to find medication that worked for her for her ADD I have advised her to discuss whether the GeneSight DNA test might be helpful in determining the right medication for her more quickly  5. Irregular menses Sangita will be following up with her gyn and may have her nexplanon removed and  switch to a progesterone-containing IUD like [place] or Skyla  6. Attention deficit disorder, unspecified hyperactivity presence Registered with Disability Resources for this and being helped by her adderall      General Counseling: Nioka verbalizes understanding of the findings of todays visit and agrees with plan of treatment. I have discussed any further diagnostic evaluation that may be needed or ordered today. We also reviewed her medications today. she has been encouraged to call the office with any questions or concerns that should arise related to todays visit.   No orders of the defined types were placed in this encounter.   No orders of the defined types were placed in this encounter.   Time spent:35 Minutes   Dr Evette Doffing Deonne Rooks AB[facility] University Physician

## 2022-09-15 MED ORDER — CIPROFLOXACIN HCL 500 MG PO TABS: 500.0000 mg | ORAL_TABLET | Freq: Two times a day (BID) | ORAL | 0 refills | Status: AC

## 2022-10-07 ENCOUNTER — Encounter: Payer: Self-pay | Admitting: Family Medicine

## 2022-10-07 ENCOUNTER — Other Ambulatory Visit: Payer: Self-pay

## 2022-10-07 ENCOUNTER — Ambulatory Visit (INDEPENDENT_AMBULATORY_CARE_PROVIDER_SITE_OTHER): Payer: Managed Care, Other (non HMO) | Admitting: Family Medicine

## 2022-10-07 VITALS — BP 100/62 | HR 88 | Temp 98.1°F | Resp 18 | Ht 61.0 in | Wt 104.0 lb

## 2022-10-07 DIAGNOSIS — M545 Low back pain, unspecified: Secondary | ICD-10-CM | POA: Diagnosis not present

## 2022-10-07 DIAGNOSIS — R5382 Chronic fatigue, unspecified: Secondary | ICD-10-CM | POA: Diagnosis not present

## 2022-10-07 DIAGNOSIS — B2790 Infectious mononucleosis, unspecified without complication: Secondary | ICD-10-CM

## 2022-10-07 DIAGNOSIS — R0982 Postnasal drip: Secondary | ICD-10-CM | POA: Diagnosis not present

## 2022-10-07 DIAGNOSIS — H6593 Unspecified nonsuppurative otitis media, bilateral: Secondary | ICD-10-CM

## 2022-10-07 NOTE — Progress Notes (Signed)
[facility] [address] [place], [place] [address] Phone: [phone] Fax: [phone]   Office Visit Note  Patient Name: Olivia Tran  Date of CBSWH:[id number]  Med Rec number [id number]  Date of Service: 10/07/2022  Other  Chief Complaint  Patient presents with   Follow-up     See prior notes and secure messages Still having lots of issues with fatigue - mostly mornings, tends to get more energy around 2pm Goes to bed around 11or 12 rather than her usual 2 or 3 am  Had a pre-syncopal episode this weekend - taking her iron supplements daily Pain into right buttock Back hurting - started last week but resolved and then today had sharp pain when bent forward  Has been in touch with [facility] Outreach re impact of mono on her academics- would like to see if she can get incompletes as she does not want to have to take medical leave of absence Not eating well - feeling full and nauseous - has lost some weight     Current Medication:  Outpatient Encounter Medications as of 10/07/2022  Medication Sig   amphetamine-dextroamphetamine (ADDERALL XR) 5 MG 24 hr capsule Take 5-10 mg by mouth every morning.   etonogestrel (NEXPLANON) 68 MG IMPL implant 1 each by Subdermal route once.   montelukast (SINGULAIR) 10 MG tablet Take 1 tablet (10 mg total) by mouth at bedtime.   norgestimate-ethinyl estradiol (ORTHO-CYCLEN) 0.25-35 MG-MCG tablet Take by mouth.   propranolol (INDERAL) 40 MG tablet Take 40 mg by mouth 3 (three) times daily.   magic mouthwash SOLN 34mL Anbesol 100mL Benadryl 60mL Mylanta  79mL swish, gargle & spit q8hr prn throat discomfort (Patient not taking: Reported on 08/19/2022)   No facility-administered encounter medications on file as of 10/07/2022.      Medical History: Past Medical History:  Diagnosis Date   ADHD    Anxiety    Asthma    Infectious mononucleosis 09/02/2022     Vital Signs: There were no vitals taken for this  visit.   Review of Systems  Constitutional:  Positive for appetite change and fatigue.  Musculoskeletal:  Positive for back pain.    Physical Exam Vitals reviewed.  Constitutional:      General: She is not in acute distress.    Appearance: Normal appearance. She is not ill-appearing.  HENT:     Right Ear: A middle ear effusion is present.     Left Ear: A middle ear effusion is present.     Ears:     Comments: Clear fluid    Nose:     Right Turbinates: Swollen.     Left Turbinates: Swollen.     Right Sinus: No maxillary sinus tenderness or frontal sinus tenderness.     Left Sinus: No maxillary sinus tenderness or frontal sinus tenderness.     Mouth/Throat:     Mouth: Mucous membranes are moist.     Pharynx: Uvula midline. No pharyngeal swelling, oropharyngeal exudate (clear postnasal drainage), posterior oropharyngeal erythema or uvula swelling.     Tonsils: No tonsillar exudate.  Pulmonary:     Effort: Pulmonary effort is normal.     Breath sounds: Normal breath sounds and air entry.  Abdominal:     Palpations: There is no hepatomegaly or splenomegaly.     Tenderness: There is abdominal tenderness in the epigastric area.  Musculoskeletal:     Lumbar back: Bony tenderness present.       Back:  Comments: Right s-i joint tenderness  Lymphadenopathy:     Cervical: Cervical adenopathy present.     Right cervical: Posterior cervical adenopathy present.     Left cervical: Posterior cervical adenopathy present.  Neurological:     Mental Status: She is alert.     Deep Tendon Reflexes:     Reflex Scores:      Patellar reflexes are 2+ on the right side and 2+ on the left side.   Assessment/Plan:  1. Acute right-sided low back pain without sciatica Use topical agents like icy hot to help  2. Chronic fatigue Due to mono and iron deficiency  3. Mononucleosis syndrome Prolonged fatigue  4. Postnasal drip Use flonase, 2 sprays in each nostril once or twice daily, every  day  5. Middle ear effusion, bilateral Should also be helped by Flonase nasal spray use      General Counseling: Ione verbalizes understanding of the findings of todays visit and agrees with plan of treatment. I have discussed any further diagnostic evaluation that may be needed or ordered today. We also reviewed her medications today. she has been encouraged to call the office with any questions or concerns that should arise related to todays visit.   No orders of the defined types were placed in this encounter.   No orders of the defined types were placed in this encounter.   Dr Evette Doffing Karey Suthers [facility] University Physician

## 2022-12-07 ENCOUNTER — Emergency Department
Admission: EM | Admit: 2022-12-07 | Discharge: 2022-12-07 | Disposition: A | Discharge status: Home / Self Care | Payer: Managed Care, Other (non HMO) | Attending: Emergency Medicine | Admitting: Emergency Medicine

## 2022-12-07 ENCOUNTER — Other Ambulatory Visit: Payer: Self-pay

## 2022-12-07 ENCOUNTER — Emergency Department: Payer: Managed Care, Other (non HMO)

## 2022-12-07 DIAGNOSIS — K5901 Slow transit constipation: Secondary | ICD-10-CM | POA: Diagnosis not present

## 2022-12-07 DIAGNOSIS — R1084 Generalized abdominal pain: Secondary | ICD-10-CM

## 2022-12-07 DIAGNOSIS — R1031 Right lower quadrant pain: Secondary | ICD-10-CM | POA: Diagnosis present

## 2022-12-07 DIAGNOSIS — N9489 Other specified conditions associated with female genital organs and menstrual cycle: Secondary | ICD-10-CM | POA: Diagnosis not present

## 2022-12-07 LAB — URINALYSIS, ROUTINE W REFLEX MICROSCOPIC
Bacteria, UA: NONE SEEN
Bilirubin Urine: NEGATIVE
Glucose, UA: NEGATIVE mg/dL
Ketones, ur: NEGATIVE mg/dL
Leukocytes,Ua: NEGATIVE
Nitrite: NEGATIVE
Protein, ur: NEGATIVE mg/dL
Specific Gravity, Urine: 1.015 (ref 1.005–1.030)
pH: 7 (ref 5.0–8.0)

## 2022-12-07 LAB — COMPREHENSIVE METABOLIC PANEL
ALT: 7 U/L (ref 0–44)
AST: 16 U/L (ref 15–41)
Albumin: 4.1 g/dL (ref 3.5–5.0)
Alkaline Phosphatase: 50 U/L (ref 38–126)
Anion gap: 10 (ref 5–15)
BUN: 12 mg/dL (ref 6–20)
CO2: 22 mmol/L (ref 22–32)
Calcium: 8.8 mg/dL — ABNORMAL LOW (ref 8.9–10.3)
Chloride: 107 mmol/L (ref 98–111)
Creatinine, Ser: 0.67 mg/dL (ref 0.44–1.00)
GFR, Estimated: >60 mL/min (ref >60)
Glucose, Bld: 96 mg/dL (ref 70–99)
Potassium: 4.2 mmol/L (ref 3.5–5.1)
Sodium: 139 mmol/L (ref 135–145)
Total Bilirubin: 0.6 mg/dL (ref 0.3–1.2)
Total Protein: 6.8 g/dL (ref 6.5–8.1)

## 2022-12-07 LAB — CBC
HCT: 41.5 % (ref 36.0–46.0)
Hemoglobin: 13.8 g/dL (ref 12.0–15.0)
MCH: 27.4 pg (ref 26.0–34.0)
MCHC: 33.3 g/dL (ref 30.0–36.0)
MCV: 82.5 fL (ref 80.0–100.0)
Platelets: 280 10*3/uL (ref 150–400)
RBC: 5.03 MIL/uL (ref 3.87–5.11)
RDW: 13 % (ref 11.5–15.5)
WBC: 6.9 10*3/uL (ref 4.0–10.5)
nRBC: 0 % (ref 0.0–0.2)

## 2022-12-07 LAB — POC URINE PREG, ED: Preg Test, Ur: NEGATIVE

## 2022-12-07 LAB — LIPASE, BLOOD: Lipase: 38 U/L (ref 11–51)

## 2022-12-07 LAB — HCG, QUANTITATIVE, PREGNANCY: hCG, Beta Chain, Quant, S: <1 m[IU]/mL (ref <5)

## 2022-12-07 MED ORDER — POLYETHYLENE GLYCOL 3350 17 G PO PACK: 17.0000 g | PACK | Freq: Every day | ORAL | 0 refills | Status: DC

## 2022-12-07 MED ORDER — MORPHINE SULFATE (PF) 2 MG/ML IV SOLN
2.0000 mg | Freq: Once | INTRAVENOUS | Status: AC
  Administered 2022-12-07: 2 mg via INTRAVENOUS
  Filled 2022-12-07: qty 1

## 2022-12-07 MED ORDER — MAGNESIUM CITRATE PO SOLN: 1.0000 | Freq: Once | ORAL | 1 refills | Status: AC

## 2022-12-07 MED ORDER — IOHEXOL 300 MG/ML  SOLN
80.0000 mL | Freq: Once | INTRAMUSCULAR | Status: AC | PRN
  Administered 2022-12-07: 80 mL via INTRAVENOUS

## 2022-12-07 MED ORDER — DOCUSATE SODIUM 100 MG PO CAPS: 100.0000 mg | ORAL_CAPSULE | Freq: Two times a day (BID) | ORAL | 2 refills | Status: DC

## 2022-12-07 MED ORDER — SODIUM CHLORIDE 0.9 % IV BOLUS
500.0000 mL | Freq: Once | INTRAVENOUS | Status: AC
  Administered 2022-12-07: 500 mL via INTRAVENOUS

## 2022-12-07 MED ORDER — ONDANSETRON HCL 4 MG/2ML IJ SOLN
4.0000 mg | Freq: Once | INTRAMUSCULAR | Status: AC
  Administered 2022-12-07: 4 mg via INTRAVENOUS
  Filled 2022-12-07: qty 2

## 2022-12-07 MED ORDER — BISACODYL 10 MG RE SUPP: 10.0000 mg | RECTAL | 0 refills | Status: DC | PRN

## 2022-12-07 NOTE — ED Provider Notes (Signed)
[facility] Provider Note    Event Date/Time   First MD Initiated Contact with Patient 12/07/22 1827     (approximate)   History   Abdominal Pain   HPI  Olivia Tran is a 22 y.o. female who presents with complaints of abdominal pain, right lower quadrant greater than left lower quadrant.  She reports this has been ongoing for about 24 hours.  No fevers or chills, no vomiting.  She is having some vaginal bleeding, reports she has had that since IVC placement.     Physical Exam   Triage Vital Signs: ED Triage Vitals [12/07/22 1817]  Enc Vitals Group     BP 118/70     Pulse Rate 70     Resp 18     Temp 98 F (36.7 C)     Temp Source Oral     SpO2 98 %     Weight 45.8 kg (101 lb)     Height 1.549 m (5\' 1" )     Head Circumference      Peak Flow      Pain Score 6     Pain Loc      Pain Edu?      Excl. in [place]?     Most recent vital signs: Vitals:   12/07/22 1817  BP: 118/70  Pulse: 70  Resp: 18  Temp: 98 F (36.7 C)  SpO2: 98%     General: Awake, no distress.  CV:  Good peripheral perfusion.  Resp:  Normal effort.  Abd:  No distention.  Mild tenderness in the right lower quadrant, mild tenderness in the left upper quadrant Other:     ED Results / Procedures / Treatments   Labs (all labs ordered are listed, but only abnormal results are displayed) Labs Reviewed  COMPREHENSIVE METABOLIC PANEL - Abnormal; Notable for the following components:      Result Value   Calcium 8.8 (*)    All other components within normal limits  URINALYSIS, ROUTINE W REFLEX MICROSCOPIC - Abnormal; Notable for the following components:   Color, Urine STRAW (*)    APPearance CLEAR (*)    Hgb urine dipstick MODERATE (*)    All other components within normal limits  LIPASE, BLOOD  CBC  HCG, QUANTITATIVE, PREGNANCY  POC URINE PREG, ED     EKG     RADIOLOGY CT abdomen pelvis, viewed interpret by me, no  appendicitis    PROCEDURES:  Critical Care performed:   Procedures   MEDICATIONS ORDERED IN ED: Medications  morphine (PF) 2 MG/ML injection 2 mg (2 mg Intravenous Given 12/07/22 1929)  ondansetron (ZOFRAN) injection 4 mg (4 mg Intravenous Given 12/07/22 1926)  sodium chloride 0.9 % bolus 500 mL (0 mLs Intravenous Stopped 12/07/22 2103)  iohexol (OMNIPAQUE) 300 MG/ML solution 80 mL (80 mLs Intravenous Contrast Given 12/07/22 2111)     IMPRESSION / MDM / ASSESSMENT AND PLAN / ED COURSE  I reviewed the triage vital signs and the nursing notes. Patient's presentation is most consistent with acute presentation with potential threat to life or bodily function.  Patient presents with abdominal pain as detailed above.  Abdominal pain is primarily in the right lower quadrant, differential includes appendicitis, ovarian cyst, constipation  Lab work reviewed and is quite reassuring, normal white blood cell count, will send for CT scan, patient treated with IV morphine, IV Zofran with improvement  CT scan is overall reassuring, most consistent with constipation this could certainly cause  the pain that she is having.  Will prescribe medications for constipation, if any worsening she is to return to the emergency department, she agrees with this plan.  No indication for admission at this time        FINAL CLINICAL IMPRESSION(S) / ED DIAGNOSES   Final diagnoses:  Generalized abdominal pain  Slow transit constipation     Rx / DC Orders   ED Discharge Orders          Ordered    bisacodyl (DULCOLAX) 10 MG suppository  As needed        12/07/22 2147    magnesium citrate SOLN   Once        12/07/22 2147    docusate sodium (COLACE) 100 MG capsule  2 times daily        12/07/22 2147    polyethylene glycol (MIRALAX) 17 g packet  Daily        12/07/22 2147             Note:  This document was prepared using Dragon voice recognition software and may include unintentional dictation  errors.   Jene Every, MD 12/07/22 2213

## 2022-12-07 NOTE — ED Triage Notes (Signed)
Pt states that she is having lower abd pain and vaginal bleeding after getting her IUD placed- pt states she has had the IUD placement checked and was told everything looked fine but the pain has gotten worse- pt states she also has low back pain- pt states it has been going on for about 4 days

## 2022-12-08 ENCOUNTER — Other Ambulatory Visit: Payer: Self-pay

## 2022-12-08 ENCOUNTER — Other Ambulatory Visit: Payer: Self-pay | Admitting: Family Medicine

## 2022-12-08 ENCOUNTER — Emergency Department
Admission: EM | Admit: 2022-12-08 | Discharge: 2022-12-08 | Disposition: A | Discharge status: Home / Self Care | Payer: Managed Care, Other (non HMO) | Attending: Emergency Medicine | Admitting: Emergency Medicine

## 2022-12-08 DIAGNOSIS — R102 Pelvic and perineal pain: Secondary | ICD-10-CM | POA: Diagnosis present

## 2022-12-08 DIAGNOSIS — K5900 Constipation, unspecified: Secondary | ICD-10-CM | POA: Diagnosis not present

## 2022-12-08 DIAGNOSIS — R300 Dysuria: Secondary | ICD-10-CM

## 2022-12-08 DIAGNOSIS — A749 Chlamydial infection, unspecified: Secondary | ICD-10-CM | POA: Diagnosis not present

## 2022-12-08 LAB — COMPREHENSIVE METABOLIC PANEL
ALT: 8 U/L (ref 0–44)
AST: 18 U/L (ref 15–41)
Albumin: 4.5 g/dL (ref 3.5–5.0)
Alkaline Phosphatase: 52 U/L (ref 38–126)
Anion gap: 8 (ref 5–15)
BUN: 9 mg/dL (ref 6–20)
CO2: 26 mmol/L (ref 22–32)
Calcium: 9 mg/dL (ref 8.9–10.3)
Chloride: 104 mmol/L (ref 98–111)
Creatinine, Ser: 0.6 mg/dL (ref 0.44–1.00)
GFR, Estimated: >60 mL/min (ref >60)
Glucose, Bld: 88 mg/dL (ref 70–99)
Potassium: 3.7 mmol/L (ref 3.5–5.1)
Sodium: 138 mmol/L (ref 135–145)
Total Bilirubin: 0.7 mg/dL (ref 0.3–1.2)
Total Protein: 7.5 g/dL (ref 6.5–8.1)

## 2022-12-08 LAB — URINALYSIS, COMPLETE (UACMP) WITH MICROSCOPIC
Bilirubin Urine: NEGATIVE
Glucose, UA: NEGATIVE mg/dL
Ketones, ur: NEGATIVE mg/dL
Leukocytes,Ua: NEGATIVE
Nitrite: NEGATIVE
Protein, ur: NEGATIVE mg/dL
Specific Gravity, Urine: 1.015 (ref 1.005–1.030)
pH: 6 (ref 5.0–8.0)

## 2022-12-08 LAB — WET PREP, GENITAL
Clue Cells Wet Prep HPF POC: NONE SEEN
Sperm: NONE SEEN
Trich, Wet Prep: NONE SEEN
WBC, Wet Prep HPF POC: >=10 — AB (ref <10)
Yeast Wet Prep HPF POC: NONE SEEN

## 2022-12-08 LAB — CBC
HCT: 41.2 % (ref 36.0–46.0)
Hemoglobin: 13.7 g/dL (ref 12.0–15.0)
MCH: 27.3 pg (ref 26.0–34.0)
MCHC: 33.3 g/dL (ref 30.0–36.0)
MCV: 82.2 fL (ref 80.0–100.0)
Platelets: 305 10*3/uL (ref 150–400)
RBC: 5.01 MIL/uL (ref 3.87–5.11)
RDW: 13.1 % (ref 11.5–15.5)
WBC: 6.8 10*3/uL (ref 4.0–10.5)
nRBC: 0 % (ref 0.0–0.2)

## 2022-12-08 LAB — CHLAMYDIA/NGC RT PCR (ARMC ONLY)
Chlamydia Tr: DETECTED — AB
N gonorrhoeae: NOT DETECTED

## 2022-12-08 LAB — PREGNANCY, URINE: Preg Test, Ur: NEGATIVE

## 2022-12-08 MED ORDER — MAGNESIUM CITRATE PO SOLN
1.0000 | Freq: Once | ORAL | Status: AC
  Administered 2022-12-08: 1 via ORAL
  Filled 2022-12-08: qty 296

## 2022-12-08 MED ORDER — CEFTRIAXONE SODIUM 1 G IJ SOLR
500.0000 mg | Freq: Once | INTRAMUSCULAR | Status: AC
  Administered 2022-12-08: 500 mg via INTRAMUSCULAR
  Filled 2022-12-08: qty 10

## 2022-12-08 MED ORDER — ONDANSETRON 4 MG PO TBDP
4.0000 mg | ORAL_TABLET | Freq: Once | ORAL | Status: AC
  Administered 2022-12-08: 4 mg via ORAL
  Filled 2022-12-08: qty 1

## 2022-12-08 MED ORDER — DOXYCYCLINE HYCLATE 100 MG PO TABS
100.0000 mg | ORAL_TABLET | Freq: Once | ORAL | Status: AC
  Administered 2022-12-08: 100 mg via ORAL
  Filled 2022-12-08: qty 1

## 2022-12-08 MED ORDER — DOXYCYCLINE HYCLATE 100 MG PO TABS: 100.0000 mg | ORAL_TABLET | Freq: Two times a day (BID) | ORAL | 0 refills | Status: AC

## 2022-12-08 MED ORDER — FLEET ENEMA 7-19 GM/118ML RE ENEM
1.0000 | ENEMA | Freq: Once | RECTAL | Status: AC
  Administered 2022-12-08: 1 via RECTAL

## 2022-12-08 NOTE — Discharge Instructions (Signed)
Please continue to drink lots of fluids and take MiraLAX daily.  You may also use Dulcolax daily.  Take antibiotics as prescribed for 1 week.  Return to the ER for any fevers, back pain, nausea, vomiting or any worsening symptoms or any urgent changes in your health

## 2022-12-08 NOTE — ED Provider Notes (Signed)
[facility] EMERGENCY DEPARTMENT Provider Note   CSN: [id number] Arrival date & time: 12/08/22  1236     History  Chief Complaint  Patient presents with   Back Pain    Olivia Tran is a 22 y.o. female with no past medical history presents to the emergency department for evaluation of lower pelvic, lower back pain.  Pains been present for 2 days.  She was seen yesterday in the emergency department and had workup consisting of CT with abdomen and pelvis, urinalysis, urine pregnancy test, CBC and BMP all within normal limits, CT scan did show significant stool burden in the colon.  She was given medications for constipation, she took the Dulcolax, MiraLAX and suppository but did not take magnesium citrate which was prescribed.  Patient denies any fevers.  Pain has persisted.  She describes a sharp pain worse with movement in her lower pelvis as well as lower back.  She denies any nausea or vomiting.  No history of constipation in the past.  HPI     Home Medications Prior to Admission medications   Medication Sig Start Date End Date Taking? Authorizing Provider  doxycycline (VIBRA-TABS) 100 MG tablet Take 1 tablet (100 mg total) by mouth 2 (two) times daily for 7 days. 12/08/22 12/15/22 Yes Evon Slack, PA-C  amphetamine-dextroamphetamine (ADDERALL XR) 5 MG 24 hr capsule Take 5-10 mg by mouth every morning. 07/17/22   [provider]  bisacodyl (DULCOLAX) 10 MG suppository Place 1 suppository (10 mg total) rectally as needed for moderate constipation. 12/07/22   Jene Every, MD  docusate sodium (COLACE) 100 MG capsule Take 1 capsule (100 mg total) by mouth 2 (two) times daily. 12/07/22 12/07/23  Jene Every, MD  etonogestrel (NEXPLANON) 68 MG IMPL implant 1 each by Subdermal route once.    [provider]  magic mouthwash SOLN 47mL Anbesol 7mL Benadryl 66mL Mylanta  78mL swish, gargle & spit q8hr prn throat discomfort Patient not taking: Reported  on 08/19/2022 03/13/22   Irean Hong, MD  montelukast (SINGULAIR) 10 MG tablet Take 1 tablet (10 mg total) by mouth at bedtime. 08/19/22   Noralee Stain, MD  norgestimate-ethinyl estradiol (ORTHO-CYCLEN) 0.25-35 MG-MCG tablet Take by mouth. 08/18/22   [provider]  polyethylene glycol (MIRALAX) 17 g packet Take 17 g by mouth daily. 12/07/22   Jene Every, MD  propranolol (INDERAL) 40 MG tablet Take 40 mg by mouth 3 (three) times daily.    [provider]      Allergies    Other    Review of Systems   Review of Systems  Physical Exam Updated Vital Signs BP 106/73 (BP Location: Left Arm)   Pulse (!) 57   Temp 97.9 F (36.6 C) (Oral)   Resp 18   SpO2 100%  Physical Exam Constitutional:      Appearance: She is well-developed.  HENT:     Head: Normocephalic and atraumatic.  Eyes:     Conjunctiva/sclera: Conjunctivae normal.  Cardiovascular:     Rate and Rhythm: Normal rate.  Pulmonary:     Effort: Pulmonary effort is normal. No respiratory distress.  Abdominal:     General: Bowel sounds are normal. There is no distension.     Palpations: Abdomen is soft.     Tenderness: There is no abdominal tenderness. There is no right CVA tenderness, left CVA tenderness or guarding.  Musculoskeletal:        General: Normal range of motion.  Cervical back: Normal range of motion.  Skin:    General: Skin is warm.     Findings: No rash.  Neurological:     General: No focal deficit present.     Mental Status: She is alert and oriented to person, place, and time.     Cranial Nerves: No cranial nerve deficit.     Motor: No weakness.  Psychiatric:        Behavior: Behavior normal.        Thought Content: Thought content normal.     ED Results / Procedures / Treatments   Labs (all labs ordered are listed, but only abnormal results are displayed) Labs Reviewed  WET PREP, GENITAL - Abnormal; Notable for the following components:      Result Value   WBC, Wet  Prep HPF POC >=10 (*)    All other components within normal limits  CHLAMYDIA/NGC RT PCR ([facility] ONLY)           - Abnormal; Notable for the following components:   Chlamydia Tr DETECTED (*)    All other components within normal limits  URINALYSIS, COMPLETE (UACMP) WITH MICROSCOPIC - Abnormal; Notable for the following components:   Color, Urine YELLOW (*)    APPearance CLEAR (*)    Hgb urine dipstick MODERATE (*)    Bacteria, UA RARE (*)    All other components within normal limits  URINE CULTURE  CBC  COMPREHENSIVE METABOLIC PANEL  PREGNANCY, URINE    EKG None  Radiology CT ABDOMEN PELVIS W CONTRAST  Result Date: 12/07/2022 CLINICAL DATA:  Right lower quadrant pain EXAM: CT ABDOMEN AND PELVIS WITH CONTRAST TECHNIQUE: Multidetector CT imaging of the abdomen and pelvis was performed using the standard protocol following bolus administration of intravenous contrast. RADIATION DOSE REDUCTION: This exam was performed according to the departmental dose-optimization program which includes automated exposure control, adjustment of the mA and/or kV according to patient size and/or use of iterative reconstruction technique. CONTRAST:  54mL OMNIPAQUE IOHEXOL 300 MG/ML  SOLN COMPARISON:  None Available. FINDINGS: Lower chest: No acute abnormality. Hepatobiliary: There is focal fatty infiltration along the falciform ligament. Otherwise, liver, gallbladder and bile ducts are within normal limits. Pancreas: Unremarkable. No pancreatic ductal dilatation or surrounding inflammatory changes. Spleen: Normal in size without focal abnormality. Adrenals/Urinary Tract: Adrenal glands are unremarkable. Kidneys are normal, without renal calculi, focal lesion, or hydronephrosis. Bladder is unremarkable. Stomach/Bowel: Stomach is within normal limits. Appendix appears normal. No evidence of bowel wall thickening, distention, or inflammatory changes. There is a large amount of stool throughout the colon.  Vascular/Lymphatic: No significant vascular findings are present. No enlarged abdominal or pelvic lymph nodes. Reproductive: There is an IUD in the uterus. Adnexa are within normal limits. Other: No abdominal wall hernia or abnormality. No abdominopelvic ascites. Musculoskeletal: No acute or significant osseous findings. IMPRESSION: 1. No acute localizing process in the abdomen or pelvis. 2. Large amount of stool throughout the colon. Electronically Signed   By: Darliss Cheney M.D.   On: 12/07/2022 21:30    Procedures Procedures    Medications Ordered in ED Medications  doxycycline (VIBRA-TABS) tablet 100 mg (has no administration in time range)  cefTRIAXone (ROCEPHIN) injection 500 mg (has no administration in time range)  magnesium citrate solution 1 Bottle (1 Bottle Oral Given 12/08/22 1651)  sodium phosphate (FLEET) 7-19 GM/118ML enema 1 enema (1 enema Rectal Given 12/08/22 1651)  ondansetron (ZOFRAN-ODT) disintegrating tablet 4 mg (4 mg Oral Given 12/08/22 1801)    ED  Course/ Medical Decision Making/ A&P                           Medical Decision Making Amount and/or Complexity of Data Reviewed Labs: ordered.  Risk OTC drugs. Prescription drug management.   22 year old female with lower pelvic pain and low back pain.  Seen yesterday had thorough workup including CT abdomen pelvis that was negative for any acute intra-abdominal process.  IUD in normal position and patient was noted to have significant stool burden.  Patient discharged home with bowel regimen, she took everything except for the magnesium citrate.  Magnesium citrate along with enema given today in the ER and patient did have a small bowel movement with some improvement of lower pelvic pain.  Blood work rechecked today, CBC and CMP within normal limits.  Vital signs are stable, afebrile nontachycardic.  Urinalysis shows slightly rare bacteria with WBCs noted on wet prep.  Gonorrhea chlamydia test positive for chlamydia.   Discussed diagnosis with patient.  She is placed on doxycycline 100 mg twice daily for 1 week and given 500 mg ceftriaxone injection IM.  She will follow-up with student clinic at [facility] and will continue with current bowel regimen.  She understands signs and symptoms return to the ER for. Final Clinical Impression(s) / ED Diagnoses Final diagnoses:  Constipation, unspecified constipation type  Pelvic pain  Chlamydia    Rx / DC Orders ED Discharge Orders          Ordered    doxycycline (VIBRA-TABS) 100 MG tablet  2 times daily        12/08/22 2112              Renata Caprice 12/08/22 2117    Naaman Plummer, MD 12/08/22 [phone]

## 2022-12-08 NOTE — ED Notes (Signed)
Pt Dc to home. Dc instructions reviewed with all questions answered. Pt ambulatory out of dept with steady gait 

## 2022-12-08 NOTE — ED Triage Notes (Addendum)
Pt comes with c/o lower back pain and some belly pain. Pt was just seen here for same pt has recent IUD placed and not sure if that is the problem. Pt had full workup yesterday with scans and bloodwork.

## 2022-12-09 LAB — URINE CULTURE: Culture: NO GROWTH

## 2022-12-10 ENCOUNTER — Encounter: Payer: Self-pay | Admitting: Family Medicine

## 2022-12-10 ENCOUNTER — Ambulatory Visit (INDEPENDENT_AMBULATORY_CARE_PROVIDER_SITE_OTHER): Payer: Managed Care, Other (non HMO) | Admitting: Family Medicine

## 2022-12-10 VITALS — BP 102/68 | HR 68 | Temp 97.4°F | Wt 103.0 lb

## 2022-12-10 DIAGNOSIS — R300 Dysuria: Secondary | ICD-10-CM

## 2022-12-10 DIAGNOSIS — R102 Pelvic and perineal pain: Secondary | ICD-10-CM

## 2022-12-10 DIAGNOSIS — Z8619 Personal history of other infectious and parasitic diseases: Secondary | ICD-10-CM | POA: Diagnosis not present

## 2022-12-10 NOTE — Progress Notes (Signed)
Olivia Tran. Olivia Tran, [place] [address] Phone: [phone] Fax: [phone]   Office Visit Note  Patient Name: Olivia Tran  Date of [id number]  Med Rec number [id number]  Date of Service: 12/10/2022  Other  Chief Complaint  Patient presents with   Personal Problem    Will discuss with Dr. Oran Rein      Had IUD inserted 11/13/2022 and developed "UTI" symptoms with pelvic pain - had negative STI test one week prior to IUD insertion  Tested positive for chlamydia at the ED in [place] after presenting with severe pelvic and abdominal pain and constipation - not sure how or why she would test positive now when all her prior tests  Discussed with someone at her PCP office who said they had another patient who also tested positive for chlamydia after being seen at the same gynecologist From description it sounds like they use metal speculums at that gyn office  Would like to be retested for chlamydia as not sure she believes the result from the ED         Current Medication:  Outpatient Encounter Medications as of 12/10/2022  Medication Sig   amphetamine-dextroamphetamine (ADDERALL XR) 5 MG 24 hr capsule Take 5-10 mg by mouth every morning.   bisacodyl (DULCOLAX) 10 MG suppository Place 1 suppository (10 mg total) rectally as needed for moderate constipation.   docusate sodium (COLACE) 100 MG capsule Take 1 capsule (100 mg total) by mouth 2 (two) times daily.   doxycycline (VIBRA-TABS) 100 MG tablet Take 1 tablet (100 mg total) by mouth 2 (two) times daily for 7 days.   magic mouthwash SOLN 13mL Anbesol 21mL Benadryl 49mL Mylanta  8mL swish, gargle & spit q8hr prn throat discomfort (Patient not taking: Reported on 08/19/2022)   montelukast (SINGULAIR) 10 MG tablet Take 1 tablet (10 mg total) by mouth at bedtime.   polyethylene glycol (MIRALAX) 17 g packet Take 17 g by mouth daily.   propranolol (INDERAL) 40 MG tablet Take 40 mg by mouth 3  (three) times daily.   [DISCONTINUED] etonogestrel (NEXPLANON) 68 MG IMPL implant 1 each by Subdermal route once.   [DISCONTINUED] norgestimate-ethinyl estradiol (ORTHO-CYCLEN) 0.25-35 MG-MCG tablet Take by mouth.   No facility-administered encounter medications on file as of 12/10/2022.      Medical History: Past Medical History:  Diagnosis Date   ADHD    Anxiety    Asthma    Infectious mononucleosis 09/02/2022     Vital Signs: BP 102/68   Pulse 68   Temp (!) 97.4 F (36.3 C) (Tympanic)   Wt 103 lb (46.7 kg)   SpO2 99%   BMI 19.46 kg/m    Review of Systems  Constitutional: Negative.   Gastrointestinal:  Positive for constipation.  Genitourinary:  Positive for dysuria and pelvic pain.    Physical Exam Vitals reviewed.  Constitutional:      Appearance: Normal appearance. She is not ill-appearing.  Neurological:     Mental Status: She is alert.  Psychiatric:     Comments: anxious    Assessment/Plan:  1. Dysuria Explained that the doxycycline she is taking will treat a "normal" UTI also and she should finish the full course  - Chlamydia/GC NAA, Confirmation  2. Pelvic pain  - Chlamydia/GC NAA, Confirmation  3. History of chlamydia  Patient requests retesting today but will complete full course of doxycycline no matter what the results show Rx for doxycycline 100mg  two times daily called to [facility] [phone] for  patient Olivia Tran (partner)after phone consultation with Olivia Tran Counseling: Olivia Tran understanding of the findings of todays visit and agrees with plan of treatment. I have discussed any further diagnostic evaluation that may be needed or ordered today. We also reviewed her medications today. she has been encouraged to call the office with any questions or concerns that should arise related to todays visit.   Orders Placed This Encounter  Procedures   [place] NAA, Confirmation    No orders of the defined types  were placed in this encounter.    Dr Olivia Tran Olivia Tran ABFM University Physician

## 2022-12-11 ENCOUNTER — Encounter: Payer: Self-pay | Admitting: Family Medicine

## 2022-12-12 ENCOUNTER — Ambulatory Visit: Payer: Managed Care, Other (non HMO) | Admitting: Family Medicine

## 2022-12-12 ENCOUNTER — Encounter: Payer: Self-pay | Admitting: Family Medicine

## 2022-12-16 LAB — C. TRACHOMATIS NAA, CONFIRM: C. trachomatis NAA, Confirm: POSITIVE — AB

## 2022-12-16 LAB — CHLAMYDIA/GC NAA, CONFIRMATION
Chlamydia trachomatis, NAA: POSITIVE — AB
Neisseria gonorrhoeae, NAA: NEGATIVE

## 2023-03-24 ENCOUNTER — Encounter: Payer: Self-pay | Admitting: Family Medicine

## 2023-03-24 ENCOUNTER — Ambulatory Visit (INDEPENDENT_AMBULATORY_CARE_PROVIDER_SITE_OTHER): Payer: Managed Care, Other (non HMO) | Admitting: Family Medicine

## 2023-03-24 VITALS — BP 112/62 | HR 98 | Temp 98.7°F | Wt 100.0 lb

## 2023-03-24 DIAGNOSIS — M549 Dorsalgia, unspecified: Secondary | ICD-10-CM

## 2023-03-24 DIAGNOSIS — B009 Herpesviral infection, unspecified: Secondary | ICD-10-CM | POA: Diagnosis not present

## 2023-03-24 DIAGNOSIS — G8929 Other chronic pain: Secondary | ICD-10-CM

## 2023-03-24 DIAGNOSIS — N644 Mastodynia: Secondary | ICD-10-CM | POA: Diagnosis not present

## 2023-03-24 DIAGNOSIS — Z113 Encounter for screening for infections with a predominantly sexual mode of transmission: Secondary | ICD-10-CM

## 2023-03-24 NOTE — Progress Notes (Signed)
[facility] [address] [place], [place] [address] Phone: [phone] Fax: [phone]   Office Visit Note  Patient Name: Olivia Tran  Date of [id number]  Med Rec number [id number]  Date of Service: 03/24/2023  Other  Chief Complaint  Patient presents with   Rash     Here with a few issues and concerns  About 3 days ago noticed lesions on face - right side of lips and below nose Occasionally itchy, no pain - no history of HSV/cold sores but has always had a lot of canker sores inside the mouth  Having some nasal tenderness - using flonase twice daily  A few days ago  developed sharp pain in each breast - fleeting but she had never had this before and has never had a breast exam  Has chronic back pain especially lower back - no sciatic symptoms, but feels like her back is "out of place"  Would like urine STI testing as well  Rash Pertinent negatives include no fever.      Current Medication:  Outpatient Encounter Medications as of 03/24/2023  Medication Sig   amphetamine-dextroamphetamine (ADDERALL XR) 5 MG 24 hr capsule Take 5-10 mg by mouth every morning.   bisacodyl (DULCOLAX) 10 MG suppository Place 1 suppository (10 mg total) rectally as needed for moderate constipation.   docusate sodium (COLACE) 100 MG capsule Take 1 capsule (100 mg total) by mouth 2 (two) times daily.   magic mouthwash SOLN 12mL Anbesol 30mL Benadryl 30mL Mylanta  5mL swish, gargle & spit q8hr prn throat discomfort (Patient not taking: Reported on 08/19/2022)   montelukast (SINGULAIR) 10 MG tablet Take 1 tablet (10 mg total) by mouth at bedtime.   polyethylene glycol (MIRALAX) 17 g packet Take 17 g by mouth daily.   propranolol (INDERAL) 40 MG tablet Take 40 mg by mouth 3 (three) times daily.   No facility-administered encounter medications on file as of 03/24/2023.      Medical History: Past Medical History:  Diagnosis Date   ADHD    Anxiety    Asthma     Infectious mononucleosis 09/02/2022     Vital Signs: BP 112/62   Pulse 98   Temp 98.7 F (37.1 C) (Tympanic)   Wt 100 lb (45.4 kg)   SpO2 97%   BMI 18.89 kg/m    Review of Systems  Constitutional:  Negative for fever.  Musculoskeletal:  Positive for back pain.  Skin:  Positive for rash.  Psychiatric/Behavioral: Negative.      Physical Exam Vitals reviewed.  Constitutional:      Appearance: Normal appearance.  HENT:     Head:      Comments: Scaly vesicular lesions on face - no honey crust    Nose: Nasal tenderness and congestion present.     Right Turbinates: Swollen.     Left Turbinates: Swollen.  Chest:     Comments: Bilateral fibrocystic chnages Musculoskeletal:     Thoracic back: Bony tenderness present.       Back:  Lymphadenopathy:     Upper Body:     Right upper body: No axillary adenopathy.     Left upper body: No axillary adenopathy.  Skin:    Comments: Lesion on   Neurological:     Mental Status: She is alert.     Assessment/Plan:  1. Screening examination for venereal disease  - Chlamydia/GC NAA, Confirmation  2. HSV infection Use the Abreva she has already bought - also consider Lysine   daily to prevent these and her canker sores  3. Chronic back pain, unspecified back location, unspecified back pain laterality Gave printed handouts for Back stretches and exercise  4. Breast pain Suspect referred pain from her back although doe shave benign fibrocysticlesions in both breasts also so could have been hormonal      General Counseling: Aadhya verbalizes understanding of the findings of todays visit and agrees with plan of treatment. I have discussed any further diagnostic evaluation that may be needed or ordered today. We also reviewed her medications today. she has been encouraged to call the office with any questions or concerns that should arise related to todays visit.   No orders of the defined types were placed in this  encounter.   No orders of the defined types were placed in this encounter.    Dr Durwin Reges Jameire Kouba ABFM University Physician

## 2023-03-26 LAB — CHLAMYDIA/GC NAA, CONFIRMATION
Chlamydia trachomatis, NAA: NEGATIVE
Neisseria gonorrhoeae, NAA: NEGATIVE

## 2023-04-08 ENCOUNTER — Ambulatory Visit (INDEPENDENT_AMBULATORY_CARE_PROVIDER_SITE_OTHER): Payer: Managed Care, Other (non HMO) | Admitting: Family Medicine

## 2023-04-08 ENCOUNTER — Other Ambulatory Visit: Payer: Self-pay

## 2023-04-08 VITALS — BP 100/60 | HR 101 | Temp 99.7°F

## 2023-04-08 DIAGNOSIS — J0191 Acute recurrent sinusitis, unspecified: Secondary | ICD-10-CM

## 2023-04-08 DIAGNOSIS — R051 Acute cough: Secondary | ICD-10-CM | POA: Diagnosis not present

## 2023-04-08 DIAGNOSIS — R5381 Other malaise: Secondary | ICD-10-CM

## 2023-04-08 DIAGNOSIS — M2550 Pain in unspecified joint: Secondary | ICD-10-CM

## 2023-04-08 MED ORDER — CEFDINIR 300 MG PO CAPS
300.0000 mg | ORAL_CAPSULE | Freq: Two times a day (BID) | ORAL | 0 refills | Status: DC
Start: 2023-04-08 — End: 2023-08-10

## 2023-04-08 NOTE — Progress Notes (Unsigned)
[facility] [address] [place], [place] [address] Phone: [phone] Fax: [phone]   Office Visit Note  Patient Name: Olivia Tran  Date of [id number]  Med Rec number [id number]  Date of Service: 04/08/2023  Other  Chief Complaint  Patient presents with   Headache   Dizziness   Cough   Nasal Congestion   Fatigue   Fever    Pt reports 101 fever on 04/07/23     See last OV -  got sick after seeing here Saw the allergist last year and is allergic to [facility]  Now has achy, fatigue, back pain, fever last night Lightheaded Headache Staying hydrated amd eating Occasional nausea Appetite changes - loose and barking Cough  Sleep disruption Knees also achy, shoulders  Mother concerned about autoimmune issues Sick even as child  - needs Ig testing       Current Medication:  Outpatient Encounter Medications as of 04/08/2023  Medication Sig   albuterol (VENTOLIN HFA) 108 (90 Base) MCG/ACT inhaler SMARTSIG:2 Puff(s) Via Inhaler Every 6 Hours   amphetamine-dextroamphetamine (ADDERALL XR) 5 MG 24 hr capsule Take 5-10 mg by mouth every morning.   montelukast (SINGULAIR) 10 MG tablet Take 1 tablet (10 mg total) by mouth at bedtime.   propranolol (INDERAL) 40 MG tablet Take 40 mg by mouth 3 (three) times daily.   [DISCONTINUED] bisacodyl (DULCOLAX) 10 MG suppository Place 1 suppository (10 mg total) rectally as needed for moderate constipation. (Patient not taking: Reported on 04/08/2023)   [DISCONTINUED] docusate sodium (COLACE) 100 MG capsule Take 1 capsule (100 mg total) by mouth 2 (two) times daily.   [DISCONTINUED] magic mouthwash SOLN 12mL Anbesol 30mL Benadryl 30mL Mylanta  5mL swish, gargle & spit q8hr prn throat discomfort (Patient not taking: Reported on 08/19/2022)   [DISCONTINUED] polyethylene glycol (MIRALAX) 17 g packet Take 17 g by mouth daily.   No facility-administered encounter medications on file as of 04/08/2023.      Medical  History: Past Medical History:  Diagnosis Date   ADHD    Anxiety    Asthma    Infectious mononucleosis 09/02/2022     Vital Signs: BP 100/60 (BP Location: Left Arm, Patient Position: Sitting)   Pulse (!) 101   Temp 99.7 F (37.6 C) (Tympanic)   SpO2 97%    Review of Systems  Physical Exam    Assessment/Plan:   General Counseling: Kerline verbalizes understanding of the findings of todays visit and agrees with plan of treatment. I have discussed any further diagnostic evaluation that may be needed or ordered today. We also reviewed her medications today. she has been encouraged to call the office with any questions or concerns that should arise related to todays visit.   No orders of the defined types were placed in this encounter.   No orders of the defined types were placed in this encounter.   Time spent:*** Minutes   Dr Durwin Reges Cassanda Walmer ABFM University Physician

## 2023-04-13 ENCOUNTER — Encounter: Payer: Self-pay | Admitting: Family Medicine

## 2023-04-13 ENCOUNTER — Ambulatory Visit (INDEPENDENT_AMBULATORY_CARE_PROVIDER_SITE_OTHER): Payer: Managed Care, Other (non HMO) | Admitting: Family Medicine

## 2023-04-13 ENCOUNTER — Other Ambulatory Visit: Payer: Self-pay

## 2023-04-13 VITALS — BP 90/60 | HR 105 | Temp 99.7°F | Wt 100.0 lb

## 2023-04-13 DIAGNOSIS — M2550 Pain in unspecified joint: Secondary | ICD-10-CM | POA: Diagnosis not present

## 2023-04-13 DIAGNOSIS — M791 Myalgia, unspecified site: Secondary | ICD-10-CM

## 2023-04-13 DIAGNOSIS — R5381 Other malaise: Secondary | ICD-10-CM | POA: Diagnosis not present

## 2023-04-13 DIAGNOSIS — R5383 Other fatigue: Secondary | ICD-10-CM

## 2023-04-13 DIAGNOSIS — J0191 Acute recurrent sinusitis, unspecified: Secondary | ICD-10-CM

## 2023-04-13 NOTE — Progress Notes (Signed)
[facility] [address] [place], [place] [address] Phone: [phone] Fax: [phone]   Office Visit Note  Patient Name: Olivia Tran  Date of [id number]  Med Rec number [id number]  Date of Service: 04/13/2023  Other  Chief Complaint  Patient presents with   Muscle Pain     See notes from last Office visit Cough and respiratory symptoms stable - taking her cefdinir Myalgias getting worse. Takes up to an hour for pains and stiffness to resolve when she gets up in the morning  Fatigue getting worse  With pain and fatigue getting worse, mother has come to [facility] to drive her home tomorrow to follow-up with her PCP and have evaluation for autoimmune disease.  Yenty has one final on the 16th - reached out to her professor last week but has not yet received and acknowledgemnt from him - concerned that she may have to miss her final depending on provider at home.  Muscle Pain      Current Medication:  Outpatient Encounter Medications as of 04/13/2023  Medication Sig   albuterol (VENTOLIN HFA) 108 (90 Base) MCG/ACT inhaler SMARTSIG:2 Puff(s) Via Inhaler Every 6 Hours   amphetamine-dextroamphetamine (ADDERALL XR) 5 MG 24 hr capsule Take 5-10 mg by mouth every morning.   cefdinir (OMNICEF) 300 MG capsule Take 1 capsule (300 mg total) by mouth 2 (two) times daily.   montelukast (SINGULAIR) 10 MG tablet Take 1 tablet (10 mg total) by mouth at bedtime.   propranolol (INDERAL) 40 MG tablet Take 40 mg by mouth 3 (three) times daily.   No facility-administered encounter medications on file as of 04/13/2023.      Medical History: Past Medical History:  Diagnosis Date   ADHD    Anxiety    Asthma    Infectious mononucleosis 09/02/2022     Vital Signs: BP 90/60   Pulse (!) 105   Temp 99.7 F (37.6 C) (Tympanic)   Wt 100 lb (45.4 kg)   SpO2 99%   BMI 18.89 kg/m    Review of Systems  HENT:  Positive for congestion.   Respiratory:  Positive for cough.    Musculoskeletal:  Positive for back pain, myalgias and neck pain.    Physical Exam Vitals reviewed.  Constitutional:      Comments: Looks exhausted  Musculoskeletal:     Cervical back: Tenderness present.     Comments: Large muscles of thighs and upper body tender to palpation  Psychiatric:        Mood and Affect: Mood normal.        Behavior: Behavior normal.        Thought Content: Thought content normal.        Judgment: Judgment normal.      Assessment/Plan:  1. Fatigue, unspecified type   2. Malaise   3. Pain in joint, multiple sites   4. Myalgia   5. Acute recurrent sinusitis, unspecified location     Documentation sent to student via MyChart that she can share with professors/Student care and Outreach that she is returning home for health-related reasons Discussed that if her doctor at home feels she is unfit to sit her exam on May 16th in person, that they should complete the appropriate form and send to [employer] Counseling: Rushie Chestnut understanding of the findings of todays visit and agrees with plan of treatment. I have discussed any further diagnostic evaluation that may be needed or ordered today. We also reviewed her medications today. she  has been encouraged to call the office with any questions or concerns that should arise related to todays visit.   No orders of the defined types were placed in this encounter.   No orders of the defined types were placed in this encounter.    Dr Evette Doffing Sravya Grissom ABFM University Physician

## 2023-04-14 ENCOUNTER — Encounter: Payer: Self-pay | Admitting: Family Medicine

## 2023-04-14 ENCOUNTER — Ambulatory Visit: Payer: Managed Care, Other (non HMO) | Admitting: Family Medicine

## 2023-04-15 ENCOUNTER — Encounter: Payer: Self-pay | Admitting: Family Medicine

## 2023-04-16 ENCOUNTER — Encounter: Payer: Self-pay | Admitting: Family Medicine

## 2023-08-10 ENCOUNTER — Encounter: Payer: Self-pay | Admitting: Family Medicine

## 2023-08-10 ENCOUNTER — Ambulatory Visit (INDEPENDENT_AMBULATORY_CARE_PROVIDER_SITE_OTHER): Payer: Managed Care, Other (non HMO) | Admitting: Medical

## 2023-08-10 ENCOUNTER — Encounter: Payer: Self-pay | Admitting: Medical

## 2023-08-10 ENCOUNTER — Other Ambulatory Visit: Payer: Self-pay

## 2023-08-10 VITALS — BP 99/61 | HR 94 | Temp 99.0°F | Ht 60.63 in | Wt 104.0 lb

## 2023-08-10 DIAGNOSIS — J069 Acute upper respiratory infection, unspecified: Secondary | ICD-10-CM

## 2023-08-10 DIAGNOSIS — M791 Myalgia, unspecified site: Secondary | ICD-10-CM | POA: Diagnosis not present

## 2023-08-10 DIAGNOSIS — M2550 Pain in unspecified joint: Secondary | ICD-10-CM | POA: Diagnosis not present

## 2023-08-10 DIAGNOSIS — R5383 Other fatigue: Secondary | ICD-10-CM

## 2023-08-10 LAB — POCT RAPID STREP A (OFFICE): Rapid Strep A Screen: NEGATIVE

## 2023-08-10 LAB — POC SOFIA 2 FLU + SARS ANTIGEN FIA
Influenza A, POC: NEGATIVE
Influenza B, POC: NEGATIVE
SARS Coronavirus 2 Ag: NEGATIVE

## 2023-08-10 NOTE — Progress Notes (Signed)
[facility] 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  Noted bodyaches (legs/shins, back) and fatigue yesterday, worse today. Noted chills this AM. No fever when checked. Also has nasal congestion. Also has HA. Mild sore throat, no cough. No nausea, vomiting or diarrhea. Some lower abdominal cramping since yesterday. Last BM 2 days ago. Did not eat much yesterday or today. Drinking water.   Roommate has strep, did not share anything. Denies close contacts with COVID.   Saw rheumatology over summer with normal workup for polyarthralgias. Had low vitamin D and mild scoliosis. Was advised to come in to repeat labs when experiencing symptoms.  Current sx feel some similar to prior "flare-ups".   Took 800 mg Ibuprofen this AM (was advised this by rheumatologist).    Current Medication:  Outpatient Encounter Medications as of 08/10/2023  Medication Sig   methenamine (HIPREX) 1 g tablet Take by mouth.   albuterol (VENTOLIN HFA) 108 (90 Base) MCG/ACT inhaler SMARTSIG:2 Puff(s) Via Inhaler Every 6 Hours   amphetamine-dextroamphetamine (ADDERALL XR) 5 MG 24 hr capsule Take 5-10 mg by mouth every morning.   propranolol (INDERAL) 40 MG tablet Take 40 mg by mouth 3 (three) times daily.   [DISCONTINUED] cefdinir (OMNICEF) 300 MG capsule Take 1 capsule (300 mg total) by mouth 2 (two) times daily. (Patient not taking: Reported on 08/10/2023)   [DISCONTINUED] montelukast (SINGULAIR) 10 MG tablet Take 1 tablet (10 mg total) by mouth at bedtime. (Patient not taking: Reported on 08/10/2023)   No facility-administered encounter medications on file as of 08/10/2023.      Medical  History: Past Medical History:  Diagnosis Date   ADHD    Anxiety    Asthma    Infectious mononucleosis 09/02/2022     Vital Signs: BP 99/61   Pulse 94   Temp 99 F (37.2 C) (Tympanic)   Ht 5' 0.63" (1.54 m)   Wt 104 lb (47.2 kg)   SpO2 99%   BMI 19.89 kg/m    Review of Systems See HPI  Physical Exam Vitals reviewed.  Constitutional:      General: She is not in acute distress.    Appearance: She is not ill-appearing.     Comments: Tired appearing  HENT:     Head: Normocephalic.     Right Ear: Ear canal and external ear normal.     Left Ear: Ear canal and external ear normal.     Ears:     Comments: TMs slightly dull    Nose: Mucosal edema (with mild erythema) and congestion present. No rhinorrhea.     Mouth/Throat:     Mouth: Mucous membranes are moist. No oral lesions.     Pharynx: Posterior oropharyngeal erythema (mild to pharynx/tonsils) present. No pharyngeal swelling.     Tonsils: No tonsillar exudate. 1+ on the right. 1+ on the left.  Cardiovascular:     Rate and Rhythm: Normal rate and regular rhythm.     Heart sounds: No murmur heard.    No friction rub. No gallop.  Pulmonary:     Effort: Pulmonary effort is normal.     Breath sounds: Normal breath sounds. No wheezing,  rhonchi or rales.  Musculoskeletal:     Cervical back: Neck supple. No rigidity.  Lymphadenopathy:     Cervical: Cervical adenopathy (1+ anterior nodes, mildly tender) present.  Neurological:     Mental Status: She is alert.    POC SOFIA 2 FLU + SARS ANTIGEN FIA     Status: Normal   Collection Time: 08/10/23  2:40 PM  Result Value Ref Range   Influenza A, POC Negative Negative   Influenza B, POC Negative Negative   SARS Coronavirus 2 Ag Negative Negative  POCT rapid strep A     Status: Normal   Collection Time: 08/10/23  2:40 PM  Result Value Ref Range   Rapid Strep A Screen Negative Negative    Assessment/Plan: 1. Acute upper respiratory infection 2. Myalgia 3. Arthralgia,  unspecified joint 4. Fatigue, unspecified type Most likely viral illness based on clinical findings. Will check CBC with diff and basic inflammatory markers. Recommended repeating COVID-19 antigen test at home in 1-2 days given high activity on campus currently. Discussed supportive measures and OTC symptomatic treatment.  - POC SOFIA 2 FLU + SARS ANTIGEN FIA - POCT rapid strep A - CBC with Differential/Platelet - Sedimentation rate - C-reactive protein  Patient Instructions  -Rest and stay well hydrated (by drinking water and other liquids). Avoid/limit caffeine. -Take over-the-counter medicines (i.e. Dayquil, Nyquil, Ibuprofen) to help relieve your symptoms. -For your sore throat, use cough drops/throat lozenges, gargle warm salt water and/or drink warm liquids (like tea with honey). -You will receive a MyChart message notifying you of your lab results when they are available.  -Send MyChart message to provider or schedule return visit in meantime as needed for new/worsening symptoms.  -Consider repeating a COVID-19 at-home test in 1-2 days.      General Counseling: Olivia Tran verbalizes understanding of the findings of todays visit and agrees with plan of treatment. she has been encouraged to call the office with any questions or concerns that should arise related to todays visit.  Time spent:20 Minutes    Jonathon Resides PA-C [facility] 08/10/2023 2:16 PM

## 2023-08-11 LAB — CBC WITH DIFFERENTIAL/PLATELET
Basophils Absolute: 0.1 10*3/uL (ref 0.0–0.2)
Basos: 1 %
EOS (ABSOLUTE): 0.3 10*3/uL (ref 0.0–0.4)
Eos: 5 %
Hematocrit: 43.3 % (ref 34.0–46.6)
Hemoglobin: 14.5 g/dL (ref 11.1–15.9)
Immature Grans (Abs): 0 10*3/uL (ref 0.0–0.1)
Immature Granulocytes: 0 %
Lymphocytes Absolute: 1.1 10*3/uL (ref 0.7–3.1)
Lymphs: 17 %
MCH: 28.5 pg (ref 26.6–33.0)
MCHC: 33.5 g/dL (ref 31.5–35.7)
MCV: 85 fL (ref 79–97)
Monocytes Absolute: 0.8 10*3/uL (ref 0.1–0.9)
Monocytes: 12 %
Neutrophils Absolute: 4 10*3/uL (ref 1.4–7.0)
Neutrophils: 65 %
Platelets: 227 10*3/uL (ref 150–450)
RBC: 5.09 x10E6/uL (ref 3.77–5.28)
RDW: 12.8 % (ref 11.7–15.4)
WBC: 6.2 10*3/uL (ref 3.4–10.8)

## 2023-08-11 LAB — C-REACTIVE PROTEIN: CRP: 4 mg/L (ref 0–10)

## 2023-08-11 LAB — SEDIMENTATION RATE: Sed Rate: 3 mm/h (ref 0–32)

## 2023-08-15 ENCOUNTER — Encounter: Payer: Self-pay | Admitting: Medical

## 2023-08-15 NOTE — Patient Instructions (Signed)
-  Rest and stay well hydrated (by drinking water and other liquids). Avoid/limit caffeine. -Take over-the-counter medicines (i.e. Dayquil, Nyquil, Ibuprofen) to help relieve your symptoms. -For your sore throat, use cough drops/throat lozenges, gargle warm salt water and/or drink warm liquids (like tea with honey). -You will receive a MyChart message notifying you of your lab results when they are available.  -Send MyChart message to provider or schedule return visit in meantime as needed for new/worsening symptoms.  -Consider repeating a COVID-19 at-home test in 1-2 days.

## 2023-09-02 ENCOUNTER — Ambulatory Visit (INDEPENDENT_AMBULATORY_CARE_PROVIDER_SITE_OTHER): Payer: Managed Care, Other (non HMO) | Admitting: Oncology

## 2023-09-02 ENCOUNTER — Encounter: Payer: Self-pay | Admitting: Oncology

## 2023-09-02 VITALS — BP 114/62 | HR 99 | Temp 97.8°F

## 2023-09-02 DIAGNOSIS — J302 Other seasonal allergic rhinitis: Secondary | ICD-10-CM | POA: Diagnosis not present

## 2023-09-02 DIAGNOSIS — R102 Pelvic and perineal pain: Secondary | ICD-10-CM | POA: Diagnosis not present

## 2023-09-02 LAB — POCT URINALYSIS DIPSTICK (MANUAL)
Leukocytes, UA: NEGATIVE
Nitrite, UA: NEGATIVE
Poct Bilirubin: NEGATIVE
Poct Blood: NEGATIVE
Poct Glucose: NORMAL mg/dL
Poct Ketones: NEGATIVE
Poct Protein: NEGATIVE mg/dL
Poct Urobilinogen: NORMAL mg/dL
Spec Grav, UA: 1.015 (ref 1.010–1.025)
pH, UA: 6.5 (ref 5.0–8.0)

## 2023-09-02 NOTE — Progress Notes (Signed)
[facility] [address] [place], [place] [address] Phone: [phone] Fax: [phone]   Office Visit Note  Patient Name: Olivia Tran  Date of ZHYQM:[id number]  Med Rec number [id number]  Date of Service: 09/02/2023  Other  Chief Complaint  Patient presents with   Acute Visit   HPI Patient is an 22 y.o. student here for complaints of RLQ abdominal pain x 2 days. Sx worsened yesterday. SX seem to be worse when she coughs or laughs. Feels like she has to push on it before she coughs.  Mild tenderness when she pushes.  No pain when she changes positions.   No hx of abdominal surgery. Gets frequent UTI's. Had cystoscopy over the summer without explanation of UTIs.   Reports she had chlamydia over the summer but was treated with doxycycline.  Feels like it may be her ovary.   Current Medication:  Outpatient Encounter Medications as of 09/02/2023  Medication Sig   albuterol (VENTOLIN HFA) 108 (90 Base) MCG/ACT inhaler SMARTSIG:2 Puff(s) Via Inhaler Every 6 Hours   amphetamine-dextroamphetamine (ADDERALL XR) 5 MG 24 hr capsule Take 5-10 mg by mouth every morning.   methenamine (HIPREX) 1 g tablet Take by mouth.   propranolol (INDERAL) 40 MG tablet Take 40 mg by mouth 3 (three) times daily.   No facility-administered encounter medications on file as of 09/02/2023.      Medical History: Past Medical History:  Diagnosis Date   ADHD    Anxiety    Asthma    Infectious mononucleosis 09/02/2022     Vital Signs: BP 114/62   Pulse 99   Temp 97.8 F (36.6 C) (Tympanic)   SpO2 97%   ROS: As per HPI.  All other pertinent ROS negative.     Review of Systems  HENT:  Positive for congestion, sinus pressure, sinus pain, sneezing and sore throat.   Respiratory:  Positive for cough.   Gastrointestinal:  Positive for abdominal pain. Negative for constipation, diarrhea, nausea, rectal pain and vomiting.  Genitourinary:  Positive for pelvic pain.    Physical  Exam Vitals reviewed.  HENT:     Right Ear: A middle ear effusion is present.     Left Ear: A middle ear effusion is present.     Nose: Congestion and rhinorrhea present. Rhinorrhea is clear.     Right Sinus: No maxillary sinus tenderness or frontal sinus tenderness.     Left Sinus: No maxillary sinus tenderness or frontal sinus tenderness.     Mouth/Throat:     Pharynx: Postnasal drip present.  Abdominal:     Palpations: There is no hepatomegaly or splenomegaly.     Tenderness: There is abdominal tenderness in the right lower quadrant. There is no right CVA tenderness, left CVA tenderness, guarding or rebound.  Lymphadenopathy:     Lower Body: Right inguinal adenopathy present. No left inguinal adenopathy.  Neurological:     Mental Status: She is alert.     No results found for this or any previous visit (from the past 24 hour(s)).  Assessment/Plan: 1. Pelvic pain Unclear etiology.  Exam reveals right inguinal lymphadenopathy.  Discussed ruling out STIs and urinary tract infection.  Will send results via MyChart. Urinalysis not consistent with UTI will send for culture.  - Chlamydia/Gonococcus/Trichomonas, NAA - POCT Urinalysis Dip Manual - Urine Culture  2. Seasonal allergic rhinitis, unspecified trigger Discussed cough and sneezing secondary to allergic rhinitis.  Recommend starting Flonase 2 sprays each nostril twice a day, antihistamine such  as Claritin or Allegra and adding a decongestant such as DayQuil/NyQuil to see if her symptoms improve. Touch base if your symptoms worsen over the next couple of days.   General Counseling: [name] verbalizes understanding of the findings of todays visit and agrees with plan of treatment. I have discussed any further diagnostic evaluation that may be needed or ordered today. We also reviewed her medications today. she has been encouraged to call the office with any questions or concerns that should arise related to todays visit.   No  orders of the defined types were placed in this encounter.   No orders of the defined types were placed in this encounter.   I spent 20 minutes dedicated to the care of this patient (face-to-face and non-face-to-face) on the date of the encounter to include what is described in the assessment and plan.   Durenda Hurt, NP 09/02/2023 2:22 PM

## 2023-09-04 ENCOUNTER — Encounter: Payer: Self-pay | Admitting: Oncology

## 2023-09-04 LAB — URINE CULTURE

## 2023-09-04 LAB — CHLAMYDIA/GONOCOCCUS/TRICHOMONAS, NAA
Chlamydia by NAA: NEGATIVE
Gonococcus by NAA: NEGATIVE
Trich vag by NAA: NEGATIVE

## 2023-09-12 ENCOUNTER — Encounter: Payer: Self-pay | Admitting: Oncology

## 2023-09-14 ENCOUNTER — Other Ambulatory Visit: Payer: Self-pay | Admitting: Oncology

## 2023-09-14 DIAGNOSIS — R102 Pelvic and perineal pain: Secondary | ICD-10-CM

## 2023-09-14 NOTE — Telephone Encounter (Signed)
Hey [place],  Can you help me get this patient scheduled for a transvaginal ultrasound?  I was not sure if she needed to call to get the appointment or if we did that for her.  I did place the order so all of that should be good to go.  Durenda Hurt, NP 09/14/2023 8:43 AM

## 2023-09-15 ENCOUNTER — Ambulatory Visit
Admission: RE | Admit: 2023-09-15 | Discharge: 2023-09-15 | Disposition: A | Discharge status: Home / Self Care | Payer: Managed Care, Other (non HMO) | Attending: Oncology | Admitting: Oncology

## 2023-09-15 ENCOUNTER — Ambulatory Visit
Admission: RE | Admit: 2023-09-15 | Discharge: 2023-09-15 | Disposition: A | Discharge status: Home / Self Care | Payer: Managed Care, Other (non HMO) | Source: Ambulatory Visit | Attending: Oncology | Admitting: Oncology

## 2023-09-15 ENCOUNTER — Other Ambulatory Visit: Payer: Self-pay | Admitting: Oncology

## 2023-09-15 ENCOUNTER — Encounter: Payer: Self-pay | Admitting: Oncology

## 2023-09-15 DIAGNOSIS — R102 Pelvic and perineal pain: Secondary | ICD-10-CM | POA: Insufficient documentation

## 2023-09-15 NOTE — Telephone Encounter (Signed)
Patient left message asking for Boneta Lucks to call her back. She did not leave a question or a reason for the call back.

## 2023-09-18 ENCOUNTER — Ambulatory Visit
Admission: RE | Admit: 2023-09-18 | Discharge: 2023-09-18 | Disposition: A | Discharge status: Home / Self Care | Payer: Managed Care, Other (non HMO) | Source: Ambulatory Visit | Attending: Oncology | Admitting: Oncology

## 2023-09-18 DIAGNOSIS — R102 Pelvic and perineal pain: Secondary | ICD-10-CM | POA: Diagnosis present

## 2023-09-21 ENCOUNTER — Encounter: Payer: Self-pay | Admitting: Oncology

## 2023-09-23 ENCOUNTER — Encounter: Payer: Self-pay | Admitting: Oncology

## 2023-09-23 ENCOUNTER — Ambulatory Visit
Admission: RE | Admit: 2023-09-23 | Discharge: 2023-09-23 | Disposition: A | Discharge status: Home / Self Care | Payer: Managed Care, Other (non HMO) | Source: Ambulatory Visit | Attending: Oncology | Admitting: Oncology

## 2023-09-23 ENCOUNTER — Other Ambulatory Visit: Payer: Self-pay | Admitting: Oncology

## 2023-09-23 DIAGNOSIS — R102 Pelvic and perineal pain: Secondary | ICD-10-CM | POA: Diagnosis present

## 2023-09-23 MED ORDER — IOHEXOL 300 MG/ML  SOLN
80.0000 mL | Freq: Once | INTRAMUSCULAR | Status: AC | PRN
  Administered 2023-09-23: 80 mL via INTRAVENOUS

## 2023-09-23 NOTE — Telephone Encounter (Signed)
I have called [facility] and spoke with a lady by the name of Elnita Maxwell.  Radiology is backed up at the moment.  Some testing is taking 7-14 days to get resulted.  She said that she would send Tilda's ultrasound results by the end of today.

## 2023-09-24 ENCOUNTER — Other Ambulatory Visit: Payer: Self-pay | Admitting: Oncology

## 2023-09-24 ENCOUNTER — Telehealth: Payer: Self-pay

## 2023-09-24 DIAGNOSIS — K4090 Unilateral inguinal hernia, without obstruction or gangrene, not specified as recurrent: Secondary | ICD-10-CM

## 2023-09-24 NOTE — Telephone Encounter (Signed)
The referral coordinator ([place]) for [facility] called to check on a referral. I spoke to [facility] nurse Per [place] patient needs to go to an general surgery. The patient referral was sent back to the referral provider.

## 2023-09-24 NOTE — Addendum Note (Signed)
Addended by: Bonnie Roig, [place] on: 09/24/2023 03:21 PM   Modules accepted: Orders

## 2023-09-24 NOTE — Telephone Encounter (Signed)
Hey I sent a referral to GI urgently so I am hoping they will get her in quickly. Not sure if you can help push this along. If not totally okay  Durenda Hurt, NP 09/24/2023 2:36 PM Ig

## 2023-09-24 NOTE — Telephone Encounter (Signed)
Referral changed from GI to General Surgery.    I have faxed referral to [facility] in [place] and [facility] in [place].  See which one can get her in the fastest.  They will contact patient directly to schedule a consult.

## 2023-09-30 ENCOUNTER — Ambulatory Visit: Payer: Managed Care, Other (non HMO) | Admitting: Medical

## 2023-10-02 ENCOUNTER — Encounter: Payer: Self-pay | Admitting: Adult Health

## 2023-10-02 ENCOUNTER — Ambulatory Visit (INDEPENDENT_AMBULATORY_CARE_PROVIDER_SITE_OTHER): Payer: Managed Care, Other (non HMO) | Admitting: Adult Health

## 2023-10-02 VITALS — BP 114/68 | HR 72 | Temp 97.3°F | Ht 61.0 in | Wt 104.0 lb

## 2023-10-02 DIAGNOSIS — K4090 Unilateral inguinal hernia, without obstruction or gangrene, not specified as recurrent: Secondary | ICD-10-CM

## 2023-10-02 NOTE — Progress Notes (Signed)
[facility] [address] [place], [place] [address] Phone: [phone] Fax: [phone]   Office Visit Note  Patient Name: Olivia Tran  Date of HKVQQ:[id number]  Med Rec number [id number]  Date of Service: 10/02/2023  Other  Chief Complaint  Patient presents with   Pain    Pelvic. Has had ct scans and xrays and was told she has ovarian cysts and inguinal hernia. Would like some sort of pain management    Other    Needs note for missed class     HPI  Here for further documentation for her consistent absence from school over the last 2 weeks.  The office of student care and outreach are asking for more specific note. She also inquired about further pain medication for her hernia pain.   She sees the surgeon next week.   Current Medication:  Outpatient Encounter Medications as of 10/02/2023  Medication Sig   albuterol (VENTOLIN HFA) 108 (90 Base) MCG/ACT inhaler SMARTSIG:2 Puff(s) Via Inhaler Every 6 Hours   amphetamine-dextroamphetamine (ADDERALL XR) 5 MG 24 hr capsule Take 5-10 mg by mouth every morning.   methenamine (HIPREX) 1 g tablet Take by mouth.   propranolol (INDERAL) 40 MG tablet Take 40 mg by mouth 3 (three) times daily.   No facility-administered encounter medications on file as of 10/02/2023.      Medical History: Past Medical History:  Diagnosis Date   ADHD    Anxiety    Asthma    Infectious mononucleosis 09/02/2022     Vital Signs: BP 114/68   Pulse 72   Temp (!) 97.3 F (36.3 C)   Ht 5\' 1"  (1.549 m)   Wt 104 lb (47.2 kg)   SpO2 99%   BMI 19.65 kg/m    Review of Systems  Constitutional:  Negative for chills, fatigue and fever.  Gastrointestinal:  Negative for diarrhea, nausea and vomiting.       Pain with palpation in right inguinal area    Physical Exam Vitals reviewed.  Constitutional:      Appearance: Normal appearance.  HENT:     Head: Normocephalic.  Musculoskeletal:     Comments: Pain with palpation in right  inguinal area  Neurological:     Mental Status: She is alert.    Assessment/Plan: 1. Non-recurrent unilateral inguinal hernia without obstruction or gangrene Continue Advil/tylenol for pain as discussed.  Note given for further documentation for school      General Counseling: gianella chismar understanding of the findings of todays visit and agrees with plan of treatment. I have discussed any further diagnostic evaluation that may be needed or ordered today. We also reviewed her medications today. she has been encouraged to call the office with any questions or concerns that should arise related to todays visit.   No orders of the defined types were placed in this encounter.   No orders of the defined types were placed in this encounter.   Time spent:20 Minutes Time spent includes review of chart, medications, test results, and follow up plan with the patient.    Johnna Acosta AGNP-C Nurse Practitioner

## 2023-10-05 ENCOUNTER — Encounter: Payer: Self-pay | Admitting: Adult Health

## 2023-10-06 ENCOUNTER — Encounter: Payer: Self-pay | Admitting: General Surgery

## 2023-10-06 ENCOUNTER — Ambulatory Visit (INDEPENDENT_AMBULATORY_CARE_PROVIDER_SITE_OTHER): Payer: Managed Care, Other (non HMO) | Admitting: General Surgery

## 2023-10-06 VITALS — BP 106/66 | HR 66 | Temp 98.5°F | Ht 61.0 in | Wt 102.4 lb

## 2023-10-06 DIAGNOSIS — K4090 Unilateral inguinal hernia, without obstruction or gangrene, not specified as recurrent: Secondary | ICD-10-CM | POA: Diagnosis not present

## 2023-10-06 NOTE — Patient Instructions (Addendum)
If you have any concerns or questions, please feel free to call our office. Follow up as needed.   Inguinal Hernia, Adult An inguinal hernia is when fat or your intestines push through a weak spot in a muscle where your leg meets your lower belly (groin). This causes a bulge. This kind of hernia could also be: In your scrotum, if you are female. In folds of skin around your vagina, if you are female. There are three types of inguinal hernias: Hernias that can be pushed back into the belly (are reducible). This type rarely causes pain. Hernias that cannot be pushed back into the belly (are incarcerated). Hernias that cannot be pushed back into the belly and lose their blood supply (are strangulated). This type needs emergency surgery. What are the causes? This condition is caused by having a weak spot in the muscles or tissues in your groin. This develops over time. The hernia may poke through the weak spot when you strain your lower belly muscles all of a sudden, such as when you: Lift a heavy object. Strain to poop (have a bowel movement). Trouble pooping (constipation) can lead to straining. Cough. What increases the risk? This condition is more likely to develop in: Males. Pregnant females. People who: Are overweight. Work in jobs that require long periods of standing or heavy lifting. Have had an inguinal hernia before. Smoke or have lung disease. These factors can lead to long-term (chronic) coughing. What are the signs or symptoms? Symptoms may depend on the size of the hernia. Often, a small hernia has no symptoms. Symptoms of a larger hernia may include: A bulge in the groin area. This is easier to see when standing. You might not be able to see it when you are lying down. Pain or burning in the groin. This may get worse when you lift, strain, or cough. A dull ache or a feeling of pressure in the groin. An abnormal bulge in the scrotum, in males. Symptoms of a strangulated  inguinal hernia may include: A bulge in your groin that is very painful and tender to the touch. A bulge that turns red or purple. Fever, feeling like you may vomit (nausea), and vomiting. Not being able to poop or to pass gas. How is this treated? Treatment depends on the size of your hernia and whether you have symptoms. If you do not have symptoms, your doctor may have you watch your hernia carefully and have you come in for follow-up visits. If your hernia is large or if you have symptoms, you may need surgery to repair the hernia. Follow these instructions at home: Lifestyle Avoid lifting heavy objects. Avoid standing for long amounts of time. Do not smoke or use any products that contain nicotine or tobacco. If you need help quitting, ask your doctor. Stay at a healthy weight. Prevent trouble pooping You may need to take these actions to prevent or treat trouble pooping: Drink enough fluid to keep your pee (urine) pale yellow. Take over-the-counter or prescription medicines. Eat foods that are high in fiber. These include beans, whole grains, and fresh fruits and vegetables. Limit foods that are high in fat and sugar. These include fried or sweet foods. General instructions You may try to push your hernia back in place by very gently pressing on it when you are lying down. Do not try to push the bulge back in if it will not go in easily. Watch your hernia for any changes in shape, size, or color.  Tell your doctor if you see any changes. Take over-the-counter and prescription medicines only as told by your doctor. Keep all follow-up visits. Contact a doctor if: You have a fever or chills. You have new symptoms. Your symptoms get worse. Get help right away if: You have pain in your groin that gets worse all of a sudden. You have a bulge in your groin that: Gets bigger all of a sudden, and it does not get smaller after that. Turns red or purple. Is painful when you touch it. You  are a female, and you have: Sudden pain in your scrotum. A sudden change in the size of your scrotum. You cannot push the hernia back in place by very gently pressing on it when you are lying down. You feel like you may vomit, and that feeling does not go away. You keep vomiting. You have a fast heartbeat. You cannot poop or pass gas. These symptoms may be an emergency. Get help right away. Call your local emergency services (911 in the [place] Do not wait to see if the symptoms will go away. Do not drive yourself to the hospital. Summary An inguinal hernia is when fat or your intestines push through a weak spot in a muscle where your leg meets your lower belly (groin). This causes a bulge. If you do not have symptoms, you may not need treatment. If you have symptoms or a large hernia, you may need surgery. Avoid lifting heavy objects. Also, avoid standing for long amounts of time. Do not try to push the bulge back in if it will not go in easily. This information is not intended to replace advice given to you by your health care provider. Make sure you discuss any questions you have with your health care provider. Document Revised: 07/17/2020 Document Reviewed: 07/17/2020 Elsevier Patient Education  2024 [employer].

## 2023-10-07 ENCOUNTER — Encounter: Payer: Self-pay | Admitting: General Surgery

## 2023-10-09 ENCOUNTER — Telehealth: Payer: Self-pay | Admitting: General Surgery

## 2023-10-09 NOTE — Telephone Encounter (Signed)
Mother calls and states that they have decided to do Lilo's surgery while she is at home in [place].  They need a referral sent along with notes urgently to Dr. Shawna Clamp in [place] [facility].  PH for doctor: [phone], fax referral to [phone].  Thank you.

## 2023-10-09 NOTE — Progress Notes (Signed)
Patient ID: Olivia Tran, female   DOB: 25-Feb-2001, 22 y.o.   MRN: [id number] CC: Right Inguinal Hernia  History of Present Illness Olivia Tran is a 22 y.o. female who presents in consultation for right inguinal hernia. The patient noticed that she had right groin pain approximately one month ago. She says that the pain worsened when she coughed or laughed and that she also noticed a bulge. The bulge was tender when she pressed on it. She had a workup that revealed a right inguinal hernia as well some loculated ascites. She also reports abdominal pain that radiates up both sides of her abdomen. She denies any nausea or vomiting or obstructive symptoms. She denies any problems on the left side.   Past Medical History Past Medical History:  Diagnosis Date   ADHD    Anxiety    Asthma    Infectious mononucleosis 09/02/2022       History reviewed. No pertinent surgical history.  Allergies  Allergen Reactions   Other     Eggplant     Current Outpatient Medications  Medication Sig Dispense Refill   albuterol (VENTOLIN HFA) 108 (90 Base) MCG/ACT inhaler SMARTSIG:2 Puff(s) Via Inhaler Every 6 Hours     amphetamine-dextroamphetamine (ADDERALL XR) 5 MG 24 hr capsule Take 5-10 mg by mouth every morning.     methenamine (HIPREX) 1 g tablet Take by mouth.     propranolol (INDERAL) 40 MG tablet Take 40 mg by mouth 3 (three) times daily.     No current facility-administered medications for this visit.    Family History History reviewed. No pertinent family history.     Social History Social History   Tobacco Use   Smoking status: Never   Smokeless tobacco: Never  Substance Use Topics   Alcohol use: Never   Drug use: Never     She is in school at [employer], studying communications and poly sci.    ROS Full ROS of systems performed and is otherwise negative there than what is stated in the HPI  Physical Exam Blood pressure 106/66, pulse 66, temperature 98.5 F (36.9 C), temperature  source Oral, height 5\' 1"  (1.549 m), weight 102 lb 6.4 oz (46.4 kg), SpO2 98%. No acute distress, PERRLA, moving all extremities spontaneously, alert and oriented x 3 Abdominal and groin exam done in the presence of a chaperone.  Abdomen is soft, nontender, nondistended.  She is quite thin.  On the right side she does have a small bulge when she Valsalva's consistent with a hernia.  I do not appreciate any hernias on the left side.  Data Reviewed Reviewed her CT scan that show a right inguinal hernia and there is some loculated ascites surrounding the inguinal region.  I have personally reviewed the patient's imaging and medical records.    Assessment    As well as is a 22 year old with right inguinal hernia on exam as well as on CT scan she is having pain from this.  Plan    Discussed with the patient that given she has pain I recommend repair of the hernia.  I told her my approach for this would be laparoscopic.  This is minimally invasive surgery where she would have 3 incisions 1 around her bellybutton to lower down.  The risks of the surgery include infection and bleeding as well as chronic nerve pain and recurrence of the hernia.  I also discussed that we would use a mesh with this to help prevent recurrence.  She is from  [place] and deciding whether she will have surgery in [place] or in [place].  When she decides if she does decide to pursue surgery in [place] she will give our office a call and we can either schedule her for surgery or bring her back for reevaluation.    Kandis Cocking 10/09/2023, 10:09 AM

## 2023-10-12 NOTE — Telephone Encounter (Signed)
Referral was faxed on Friday 10/09/23.

## 2023-11-19 NOTE — Progress Notes (Signed)
 CONSULT NOTE  Chief Complaint: Chief Complaint  Patient presents with  . Consult    Inguinal Hernia    Referring Provider: Unknown  PCP (if different from above): Verita Delon Lava, RN   History of Present Illness: The patient is a 22 y.o. who presents with the above chief complaint.     This note is drafted with the assistance of AI software.  I obtained verbal consent from the patient or their proxy to record this visit for purposes of producing a draft of the encounter documentation.  History of Present Illness   The patient, a 22 year old with a right inguinal hernia, first noticed the condition in October 2024. The hernia was associated with pain that worsened with coughing or laughing, and a palpable bulge was noted in the area. The patient also reported tenderness upon palpation.  Initial diagnostic workup included abdominal films, which were benign. A pelvic ultrasound revealed two cystic structures in the right inguinal region of indeterminate etiology, a benign functional cyst on the left ovary, and a small volume of free fluid in the pelvic region.  A CT scan performed on September 18, 2023, showed a 1.7 x 2.5 cm fluid density in the right deep inguinal region and a 2.3 x 1.3 cm fluid density in the right inguinal region. These findings were suggestive of a small fat or fluid-containing right inguinal hernia, which was mildly progressive. Notably, these findings were not present on a CT scan performed in January 2024, which showed a large amount of stool throughout the colon but no acute findings.     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 The patient first experienced severe abdominal pain a year ago, which was initially diagnosed as constipation. The pain was described as a stabbing sensation in the lower mid-abdomen, which was alleviated after  treatment for constipation.  A few months ago, the patient began experiencing similar abdominal cramps, which were initially attributed to her intrauterine device (IUD). Around the same time, the patient noticed a palpable lump in the lower right abdomen, which prompted further medical evaluation. The patient underwent multiple diagnostic tests, including an ultrasound and a CT scan, which revealed two ovarian cysts, free fluid, and a hernia.  The patient  manually reduced it.   Since this intervention, the hernia has not reappeared. However, the patient continues to experience intermittent abdominal cramping and has reported significant fatigue, particularly in the mornings. The patient is unsure if these symptoms are related to the hernia.  The patient has a history of frequent urinary tract infections and has been experiencing worsening back pain. The patient does not participate in strenuous physical activities and has no known family history of hernias. The patient is adopted, and her biological family medical history is unknown.  The patient is considering surgical repair of the hernia but is concerned about the timing due to upcoming travel plans and the potential for visible scarring. The patient is also considering delaying the surgery if it is not directly causing her fatigue and abdominal cramping.     Results   RADIOLOGY Abdominal films: benign Pelvic ultrasound: two cystic structures in the right inguinal region of indeterminate etiology, left ovarian benign functional cyst, small volume pelvic free fluid CT scan (09/18/2023): 1.7 x 2.5 cm fluid density in the right deep inguinal region, 2.3 x 1.3 cm fluid density in the right inguinal region, mildly  progressive suggesting a small fat or fluid containing right inguinal hernia CT scan (12/2022): large amount of stool throughout the colon, no acute findings         ROS: Review of Systems Form filled out by patient, scanned, and  we reviewed pertinent complaints today.  For any positive ROS complaints beyond the scope of today's visit, the patient was advised to discuss/address these symptoms with the PCP and/or appropriate specialty service if a relationship already exists.   Past Medical History: Past Medical History:  Diagnosis Date  . Anxiety disorder   . Asthma   . History of mononucleosis 09/02/2022    Past Surgical History: No past surgical history on file.  Medications: Current Outpatient Medications  Medication Sig Dispense Refill Last Dispense  . albuterol 90 mcg/actuation inhaler Inhale 2 puffs into the lungs every 6 (six) hours as needed for wheezing.   Unknown (patient-reported)  . dextroamphetamine-amphetamine (ADDERALL) 5 mg Tab 5 mg 2 (two) times a day.   Unknown (patient-reported)  . ibuprofen (ADVIL,MOTRIN) 800 MG tablet Take 800 mg by mouth.   Unknown (patient-reported)  . methenamine (HIPREX) 1 gram tablet Take 1 tablet (1 g total) by mouth 2 (two) times a day with meals. (Patient taking differently: Take 1 g by mouth 2 (two) times a day with meals. As needed to help prevent bladder infections) 60 tablet 5 Unknown (outside pharmacy)  . propranoloL (INDERAL) 40 MG immediate release tablet Take 40 mg by mouth.   Unknown (patient-reported)  . methylphenidate HCl (JORNAY PM) 20 mg ER capsule Take 20 mg by mouth.   Unknown (patient-reported)  . nitrofurantoin (MACROBID) 100 MG capsule Take 1 capsule (100 mg total) by mouth 2 (two) times a day. Take first dose morning of office procedure. 2 capsule 0 Unknown (outside pharmacy)   No current facility-administered medications for this visit.    Allergies: Eggplant and Other  Social History: Social History   Tobacco Use  . Smoking status: Never  . Smokeless tobacco: Never  Substance Use Topics  . Alcohol use: Never  . Drug use: Never   Occupation:  Building surveyor in [place] Level of activity:  planning semester abroad in  [place] Living situation:  accompanied by her mother  Family Medical History: No family history on file.   PHYSICAL EXAM BP 104/68 (BP Location: Left arm, Patient Position: Sitting, Cuff Size: Large)   Pulse 62   Temp 97.5 F (Tympanic)   Ht 154.9 cm (5' 1)   Wt 47.4 kg (104 lb 8 oz)   BMI 19.75 kg/m    WDWN NAD nontoxic Sclerae anicteric  Resp unlabored no wheezing RRR Abd soft, nontender, nondistended, no masses appreciated Skin warm and dry Alert, Oriented, Appropriate Physical Exam        ABDOMEN: No palpable abnormalities detected upon examination. Tenderness noted in the right inguinal area upon palpation. No tenderness or abnormalities detected in the left inguinal area.   ASSESSMENT: 1. Non-recurrent unilateral inguinal hernia without obstruction or gangrene       PLAN:     Right Inguinal Hernia Noted in October 2024, with worsening pain on coughing or laughing. CT scan on 09/18/2023 showed a 1.7 x 2.5 cm fluid density in the right deep inguinal region and a 2.3 x 1.3 cm fluid density in the right inguinal region, suggesting a small fat or fluid-containing right inguinal hernia. This was not present on a CT scan done in January 2024. -Plan for surgical consultation and potential hernia repair.  Assessment & Plan Right Inguinal Hernia Small, fluid or fat-containing hernia identified on CT scan. No current visible bulge or severe pain. Discussed the risks/benefits of surgical repair (laparoscopic with mesh) vs observation. - Patient to consider options and decide on surgical repair or observation.  Abdominal Pain History of severe abdominal pain attributed to constipation. Current intermittent cramping pain in the same area. No clear correlation with hernia. - No specific plan discussed in the conversation.  Fatigue Significant morning fatigue reported. No clear etiology identified. - No specific plan discussed in the conversation.  Recurrent  UTIs History of recurrent UTIs. No current symptoms. - No specific plan discussed in the conversation.     Return if she decides to pursue surgery.    Montie Mac Radon, MD, FACS [facility]   Quality Clinical Documentation:                                  CC:   Verita Delon Lava, RN Unknown, Unknown, MD   Note:  A portion of this note was dictated using digital voice recognition and as such may have some unintentional errors.  If any confusion, please feel free to contact me.

## 2024-08-16 ENCOUNTER — Ambulatory Visit (INDEPENDENT_AMBULATORY_CARE_PROVIDER_SITE_OTHER): Admitting: Physician Assistant

## 2024-08-16 ENCOUNTER — Other Ambulatory Visit: Payer: Self-pay

## 2024-08-16 VITALS — BP 100/58 | HR 62 | Temp 97.3°F | Wt 109.0 lb

## 2024-08-16 DIAGNOSIS — R52 Pain, unspecified: Secondary | ICD-10-CM | POA: Diagnosis not present

## 2024-08-16 DIAGNOSIS — R5383 Other fatigue: Secondary | ICD-10-CM | POA: Diagnosis not present

## 2024-08-16 LAB — POC COVID19/FLU A&B COMBO
Covid Antigen, POC: NEGATIVE
Influenza A Antigen, POC: NEGATIVE
Influenza B Antigen, POC: NEGATIVE

## 2024-08-16 NOTE — Progress Notes (Signed)
 [facility] [address] [place], [place] [address] Phone: [phone] Fax: [phone]   Office Visit Note  Patient Name: Olivia Tran  Date of Apmuy:[id number]  Med Rec number [id number]  Date of Service: 08/16/2024  Other  No chief complaint on file.    HPI The patient reports feeling "off" over the past several weeks, noting fatigue, generalized body aches, and low motivation despite adequate sleep. 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 She has previously seen a rheumatologist and was evaluated for Lyme disease, with infectious disease noting it may be a different or atypical form of Lyme. She emphasizes that her symptoms are distinct from any prior mental health concerns. The patient is requesting blood work to help identify potential underlying causes.   Current Medication:  Outpatient Encounter Medications as of 08/16/2024  Medication Sig   albuterol (VENTOLIN HFA) 108 (90 Base) MCG/ACT inhaler SMARTSIG:2 Puff(s) Via Inhaler Every 6 Hours   amphetamine-dextroamphetamine (ADDERALL XR) 5 MG 24 hr capsule Take 5-10 mg by mouth every morning.   methenamine (HIPREX) 1 g tablet Take by mouth.   propranolol (INDERAL) 40 MG tablet Take 40 mg by mouth 3 (three) times daily.   No facility-administered encounter medications on file as of 08/16/2024.      Medical History: Past Medical History:  Diagnosis Date   ADHD    Anxiety    Asthma    Infectious mononucleosis 09/02/2022     Vital Signs: There were no vitals taken for this visit.   Review of Systems  Physical Exam Constitutional:      Appearance: Normal appearance.  HENT:     Head: Normocephalic and atraumatic.     Right Ear: Tympanic membrane normal.     Left Ear: Tympanic membrane normal.     Mouth/Throat:     Mouth: Mucous membranes are moist.     Pharynx: Oropharynx is clear.  Cardiovascular:     Rate and Rhythm:  Normal rate and regular rhythm.     Pulses: Normal pulses.     Heart sounds: Normal heart sounds.  Pulmonary:     Effort: Pulmonary effort is normal.     Breath sounds: Normal breath sounds.  Musculoskeletal:     Cervical back: Normal range of motion and neck supple.  Neurological:     Mental Status: She is alert.       Assessment/Plan: Patient presents with fatigue and generalized body aches. Overall exam is reassuring. Point-of-care testing was negative. It is reasonable to initiate a basic fatigue laboratory workup. Discussed that specialists often order additional, more targeted labs as part of their evaluation, and recommended follow-up with them for further assessment. Supportive care for symptom management was reviewed. Patient advised on precautions and is agreeable to the plan.  General Counseling: Olympia verbalizes understanding of the findings of todays visit and agrees with plan of treatment. I have discussed any further diagnostic evaluation that may be needed or ordered today. We also reviewed her medications today. she has been encouraged to call the office with any questions or concerns that should arise related to todays visit.   No orders of the defined types were placed in this encounter.   No orders of the defined types were placed in this encounter.   Time spent:20 Minutes Time spent includes review of chart, medications, test results, and follow up plan with the patient.    Morene Potash, PA-C [facility]

## 2024-08-22 ENCOUNTER — Telehealth: Payer: Self-pay | Admitting: Physician Assistant

## 2024-08-22 NOTE — Telephone Encounter (Signed)
 Pt called and was wanting to know the results of her labs that were done last Tuesday 08/16/2024. She saw Odis, you where the CMA so forwarding to you to see if you can take a look and see what is going on and why they are not back yet.

## 2024-08-26 LAB — CBC WITH DIFFERENTIAL/PLATELET
Basophils Absolute: 0.1 x10E3/uL (ref 0.0–0.2)
Basos: 1 %
EOS (ABSOLUTE): 0.4 x10E3/uL (ref 0.0–0.4)
Eos: 8 %
Hematocrit: 45.3 % (ref 34.0–46.6)
Hemoglobin: 15.2 g/dL (ref 11.1–15.9)
Immature Grans (Abs): 0 x10E3/uL (ref 0.0–0.1)
Immature Granulocytes: 0 %
Lymphocytes Absolute: 1.9 x10E3/uL (ref 0.7–3.1)
Lymphs: 39 %
MCH: 30 pg (ref 26.6–33.0)
MCHC: 33.6 g/dL (ref 31.5–35.7)
MCV: 90 fL (ref 79–97)
Monocytes Absolute: 0.4 x10E3/uL (ref 0.1–0.9)
Monocytes: 8 %
Neutrophils Absolute: 2.2 x10E3/uL (ref 1.4–7.0)
Neutrophils: 44 %
Platelets: 264 x10E3/uL (ref 150–450)
RBC: 5.06 x10E6/uL (ref 3.77–5.28)
RDW: 12.5 % (ref 11.7–15.4)
WBC: 4.9 x10E3/uL (ref 3.4–10.8)

## 2024-08-26 LAB — THYROID CASCADE PROFILE: TSH: 1.15 u[IU]/mL (ref 0.450–4.500)

## 2024-08-26 LAB — COMPREHENSIVE METABOLIC PANEL WITH GFR
ALT: 6 IU/L (ref 0–32)
AST: 12 IU/L (ref 0–40)
Albumin: 5 g/dL (ref 4.0–5.0)
Alkaline Phosphatase: 69 IU/L (ref 41–116)
BUN/Creatinine Ratio: 14 (ref 9–23)
BUN: 11 mg/dL (ref 6–20)
Bilirubin Total: 1 mg/dL (ref 0.0–1.2)
CO2: 21 mmol/L (ref 20–29)
Calcium: 9.6 mg/dL (ref 8.7–10.2)
Chloride: 103 mmol/L (ref 96–106)
Creatinine, Ser: 0.77 mg/dL (ref 0.57–1.00)
Globulin, Total: 2.3 g/dL (ref 1.5–4.5)
Glucose: 96 mg/dL (ref 70–99)
Potassium: 4.1 mmol/L (ref 3.5–5.2)
Sodium: 141 mmol/L (ref 134–144)
Total Protein: 7.3 g/dL (ref 6.0–8.5)
eGFR: 112 mL/min/1.73 (ref >59)

## 2024-08-26 LAB — VITAMIN D 1,25 DIHYDROXY
Vitamin D 1, 25 (OH)2 Total: 60 pg/mL
Vitamin D2 1, 25 (OH)2: <10 pg/mL
Vitamin D3 1, 25 (OH)2: 60 pg/mL

## 2024-08-26 LAB — VITAMIN B12: Vitamin B-12: 549 pg/mL (ref 232–1245)

## 2024-08-29 ENCOUNTER — Ambulatory Visit: Payer: Self-pay | Admitting: Physician Assistant

## 2024-09-14 ENCOUNTER — Ambulatory Visit (INDEPENDENT_AMBULATORY_CARE_PROVIDER_SITE_OTHER): Admitting: Family Medicine

## 2024-09-14 ENCOUNTER — Encounter: Payer: Self-pay | Admitting: Family Medicine

## 2024-09-14 ENCOUNTER — Other Ambulatory Visit: Payer: Self-pay

## 2024-09-14 VITALS — BP 92/61 | HR 68 | Temp 96.8°F

## 2024-09-14 DIAGNOSIS — R590 Localized enlarged lymph nodes: Secondary | ICD-10-CM

## 2024-09-14 DIAGNOSIS — J069 Acute upper respiratory infection, unspecified: Secondary | ICD-10-CM

## 2024-09-14 DIAGNOSIS — H1032 Unspecified acute conjunctivitis, left eye: Secondary | ICD-10-CM

## 2024-09-14 MED ORDER — TOBRAMYCIN-DEXAMETHASONE 0.3-0.1 % OP SUSP
2.0000 [drp] | Freq: Two times a day (BID) | OPHTHALMIC | 0 refills | Status: AC
Start: 2024-09-14 — End: ?

## 2024-09-14 MED ORDER — AZITHROMYCIN 250 MG PO TABS
ORAL_TABLET | ORAL | 0 refills | Status: AC
Start: 2024-09-14 — End: 2024-09-19

## 2024-09-14 NOTE — Progress Notes (Unsigned)
   Assessment & Plan:  Patient ID: Olivia Tran, female    DOB: February 28, 2001  Age: 23 y.o. MRN: [id number]  Problem List Items Addressed This Visit   None Visit Diagnoses       Acute conjunctivitis of left eye, unspecified acute conjunctivitis type    -  Primary   Relevant Medications   tobramycin-dexamethasone  (TOBRADEX) ophthalmic solution   Other Relevant Orders   POCT Mono (Epstein Barr Virus)     LAD (lymphadenopathy) of right cervical region       Relevant Orders   POCT Mono (Epstein Barr Virus)     URI with cough and congestion       Relevant Medications   azithromycin (ZITHROMAX) 250 MG tablet        Follow-up: Return if symptoms worsen or fail to improve. Precautions given for abx opth use, monitor for rashes, any burning sensation in eye, headaches or lightsensitivity.   Subjective:    CC: The primary encounter diagnosis was Acute conjunctivitis of left eye, unspecified acute conjunctivitis type. Diagnoses of LAD (lymphadenopathy) of right cervical region and URI with cough and congestion were also pertinent to this visit.  HPI Olivia Tran presents for congestion symptoms, cold like but then woke up with left eye matted and had yellow dc, puffy eyelids, and tried to wash it off. Denies any headaches,  light sensitivity, rashes burning, numbness or tingling. She states she has had mono before   History Olivia Tran has a past medical history of ADHD, Anxiety, Asthma, and Infectious mononucleosis (09/02/2022).   She has no past surgical history on file.   Her family history is not on file.She reports that she has never smoked. She has never used smokeless tobacco. She reports that she does not drink alcohol and does not use drugs.  Outpatient Medications Prior to Visit  Medication Sig Dispense Refill   albuterol (VENTOLIN HFA) 108 (90 Base) MCG/ACT inhaler SMARTSIG:2 Puff(s) Via Inhaler Every 6 Hours     amphetamine-dextroamphetamine (ADDERALL XR) 5 MG 24 hr capsule Take  5-10 mg by mouth every morning.     amphetamine-dextroamphetamine (ADDERALL) 10 MG tablet Take 10 mg by mouth daily.     methenamine (HIPREX) 1 g tablet Take by mouth.     propranolol (INDERAL) 20 MG tablet Take 20 mg by mouth 2 (two) times daily as needed.     propranolol (INDERAL) 40 MG tablet Take 40 mg by mouth 3 (three) times daily.     No facility-administered medications prior to visit.    ROS per HPI ROS  Objective:  BP 92/61   Pulse 68   Temp (!) 96.8 F (36 C) (Tympanic)   SpO2 98%   Physical Exam GEN: A&O x 3, NAD, well nourished HEENT: Normocephalic, atraumatic, EOMI, OP patent, bilateral TMs normal,+ ant LAD, + PND, + conjunctivitis left, no herpetic lesions on fundoscopic exam RESP: CTAB, no R/R/W CV: RRR, no M/R/G ABDOMEN: soft, nontender, normal BS, no HSM VAS: no Olivia Tran swelling NEURO: A&O x 3 MSK: normal gait, normal ROM UE and Olivia Tran: normal speech, normal mood SKIN: no rashes or lesions   Olivia Tran Phuong Loy Mccartt, DO

## 2024-09-15 ENCOUNTER — Ambulatory Visit: Payer: Self-pay | Admitting: Family Medicine

## 2024-09-15 ENCOUNTER — Encounter: Payer: Self-pay | Admitting: Family Medicine

## 2024-09-15 LAB — POCT MONO (EPSTEIN BARR VIRUS): Mono, POC: NEGATIVE

## 2024-09-29 ENCOUNTER — Ambulatory Visit: Admitting: Medical

## 2024-10-12 ENCOUNTER — Other Ambulatory Visit: Payer: Self-pay

## 2024-10-12 ENCOUNTER — Encounter: Payer: Self-pay | Admitting: Family Medicine

## 2024-10-12 ENCOUNTER — Ambulatory Visit (INDEPENDENT_AMBULATORY_CARE_PROVIDER_SITE_OTHER): Admitting: Family Medicine

## 2024-10-12 VITALS — HR 104 | Temp 99.5°F

## 2024-10-12 DIAGNOSIS — H9201 Otalgia, right ear: Secondary | ICD-10-CM

## 2024-10-12 DIAGNOSIS — R6889 Other general symptoms and signs: Secondary | ICD-10-CM | POA: Diagnosis not present

## 2024-10-12 DIAGNOSIS — J039 Acute tonsillitis, unspecified: Secondary | ICD-10-CM

## 2024-10-12 MED ORDER — PREDNISONE 10 MG PO TABS: 10.0000 mg | ORAL_TABLET | Freq: Every day | ORAL | 0 refills | Status: AC

## 2024-10-12 MED ORDER — CEPHALEXIN 500 MG PO CAPS
500.0000 mg | ORAL_CAPSULE | Freq: Two times a day (BID) | ORAL | 0 refills | Status: AC
Start: 2024-10-12 — End: ?

## 2024-10-12 NOTE — Progress Notes (Addendum)
 Assessment & Plan:  Patient ID: Olivia Tran, female    DOB: Oct 10, 2001  Age: 23 y.o. MRN: [id number]  Problem List Items Addressed This Visit   None Visit Diagnoses       Tonsillitis    -  Primary   Relevant Medications   cephALEXin (KEFLEX) 500 MG capsule   Other Relevant Orders   Upper Respiratory Culture, Routine   POCT rapid strep A     Flu-like symptoms       Relevant Orders   POC Covid19/Flu A&B Antigen     Otalgia of right ear          Assessment and Plan Assessment & Plan Acute upper respiratory infection with pharyngitis and sinus symptoms Acute upper respiratory infection with pharyngitis and sinus symptoms,  negative strep test and covid, patient has a hx of asthma and has had  to use inhaler more frequently  so in the event this is an early acute onset asthmatic bronchitis with bacterial tonsillitis I will prescribe antibiotics and steroids.  - Respiratory Culture pending - Advised taking 800 mg of Advil every 8 hours with food. - Advised taking Tylenol if still experiencing aches, pains, or fever.  Generalized myalgia and arthralgia Generalized myalgia and arthralgia, possibly related to UR  infection or other conditions. - Advised taking 800 mg of Advil every 8 hours with food. - Advised taking Tylenol if still experiencing aches or pains.  Headache Persistent headache, possibly related to infection or dehydration. - Advised taking 800 mg of Advil every 8 hours with food. - Advised taking Tylenol if still experiencing headache.  Rash of neck and upper chest likely viral URI exanthem or fever related exanthem in etiology  Intermittent rash on neck and upper chest. Is not present on exam at this time.   Fatigue Significant fatigue, possibly related to viral infection and inadequate nutrition and hydration. - Encouraged hydration with water. - Advised eating small meals like soup and crackers.  Anthonia  also mentioned acute on chronic bone pain with any  illness she gets-patient will follow up as needed for persistent oint bone pain with illness, she has been to a rheumatologist and has been evaluated for this based on her description and also chart review.    Follow-up: Return if symptoms worsen or fail to improve.    Subjective:    CC: The primary encounter diagnosis was Tonsillitis. Diagnoses of Flu-like symptoms and Otalgia of right ear were also pertinent to this visit.  HPI  Discussed the use of AI scribe software for clinical note transcription with the patient, who gave verbal consent to proceed.  History of Present Illness Olivia Tran is a 23 year old female who presents with sore throat, joint pain, and fatigue for the last 2 days .  Pharyngitis and upper respiratory symptoms - Onset of sore throat and nasal congestion last night - Sore throat is more severe than congestion, described as 'raspy' with soreness on the sides - Sneezing attacks present - Itchy ears, especially on the side with previous ear tubes - Pressure in ears with occasional ringing - Hx of asthma, prn albuterol  Constitutional symptoms - Significant fatigue, mostly bedridden due to severity - Intermittent fevers with sweating and chills - Diet limited to soup due to feeling unwell when eating  Musculoskeletal symptoms - Joint pain in knees, arms, and shoulders - Muscle soreness throughout body - History of joint pain previously evaluated by rheumatologist - Taking Advil 400 mg for pain relief  Headache - Severe headaches present throughout the day  Dermatologic symptoms - Transient rash appeared last night on neck and upper body  Gastrointestinal symptoms - Use of Zofran  for symptom relief  Past infectious history - History of mononucleosis    History Olivia Tran has a past medical history of ADHD, Anxiety, Asthma, and Infectious mononucleosis (09/02/2022).   She has no past surgical history on file.   Her family history is not on  file.She reports that she has never smoked. She has never used smokeless tobacco. She reports that she does not drink alcohol and does not use drugs.  Outpatient Medications Prior to Visit  Medication Sig Dispense Refill   albuterol (VENTOLIN HFA) 108 (90 Base) MCG/ACT inhaler SMARTSIG:2 Puff(s) Via Inhaler Every 6 Hours     amphetamine-dextroamphetamine (ADDERALL XR) 5 MG 24 hr capsule Take 5-10 mg by mouth every morning.     amphetamine-dextroamphetamine (ADDERALL) 10 MG tablet Take 10 mg by mouth daily.     methenamine (HIPREX) 1 g tablet Take by mouth.     propranolol (INDERAL) 20 MG tablet Take 20 mg by mouth 2 (two) times daily as needed.     propranolol (INDERAL) 40 MG tablet Take 40 mg by mouth 3 (three) times daily.     tobramycin-dexamethasone  (TOBRADEX) ophthalmic solution Place 2 drops into the left eye in the morning and at bedtime. For a total of 3 days 2.5 mL 0   No facility-administered medications prior to visit.    ROS per HPI Review of Systems  Constitutional:  Positive for chills, fever and malaise/fatigue.  HENT:  Positive for sore throat.   Respiratory:  Positive for cough.     Objective:  Pulse (!) 104   Temp 99.5 F (37.5 C) (Tympanic)   SpO2 99%   Physical Exam GEN: A&O x 3, NAD, well nourished HEENT: Normocephalic, atraumatic, EOMI, OP patent, bilateral TMs normal, no LAD, no thyromegaly, + enlarged tonsils, erythematous, + exudates. Nontender sinuses RESP: CTAB, no R/R/W CV: RRR, no M/R/G ABDOMEN: soft, nontender, normal BS, no HSM VAS: no Olivia Tran swelling NEURO: A&O x 3 MSK: normal gait, normal ROM UE and Olivia Tran PSYCH: normal speech, normal mood SKIN: no rashes or lesions   Dr. Jerel Jaynie Tran ABFM University Physician   General Counseling: Olivia Tran verbalizes understanding of the findings of today's visit and agrees with plan of treatment. I have discussed any further diagnostic evaluation that may be needed or ordered today. We also reviewed their  medications and precautions needed today. They have been encouraged to mychart message/call the office with any questions or concerns that should arise related to today's visit.

## 2024-10-13 ENCOUNTER — Encounter: Payer: Self-pay | Admitting: Family Medicine

## 2024-10-13 ENCOUNTER — Ambulatory Visit: Payer: Self-pay | Admitting: Family Medicine

## 2024-10-13 LAB — POCT RAPID STREP A (OFFICE): Rapid Strep A Screen: NEGATIVE

## 2024-10-13 LAB — POC COVID19/FLU A&B COMBO
Covid Antigen, POC: NEGATIVE
Influenza A Antigen, POC: NEGATIVE
Influenza B Antigen, POC: NEGATIVE

## 2024-10-15 LAB — UPPER RESPIRATORY CULTURE, ROUTINE
